# Patient Record
Sex: Male | Born: 1935 | Race: White | Hispanic: No | Marital: Single | Smoking: Former smoker
Health system: Southern US, Community
[De-identification: ages and names within clinical notes are randomized; demographics above are authoritative.]

## PROBLEM LIST (undated history)

## (undated) DIAGNOSIS — I714 Abdominal aortic aneurysm, without rupture, unspecified: Secondary | ICD-10-CM

## (undated) DIAGNOSIS — F419 Anxiety disorder, unspecified: Secondary | ICD-10-CM

## (undated) DIAGNOSIS — H269 Unspecified cataract: Secondary | ICD-10-CM

## (undated) DIAGNOSIS — I10 Essential (primary) hypertension: Secondary | ICD-10-CM

## (undated) HISTORY — DX: Unspecified cataract: H26.9

## (undated) HISTORY — PX: ABDOMINAL AORTIC ANEURYSM REPAIR: SUR1152

## (undated) HISTORY — DX: Essential (primary) hypertension: I10

## (undated) HISTORY — DX: Abdominal aortic aneurysm, without rupture, unspecified: I71.40

## (undated) HISTORY — DX: Abdominal aortic aneurysm, without rupture: I71.4

## (undated) HISTORY — DX: Anxiety disorder, unspecified: F41.9

---

## 2001-11-30 ENCOUNTER — Encounter: Payer: Self-pay | Admitting: Family Medicine

## 2001-11-30 ENCOUNTER — Encounter: Admission: RE | Admit: 2001-11-30 | Discharge: 2001-11-30 | Payer: Self-pay | Admitting: Family Medicine

## 2004-04-07 ENCOUNTER — Ambulatory Visit: Payer: Self-pay | Admitting: Family Medicine

## 2004-04-09 ENCOUNTER — Encounter: Admission: RE | Admit: 2004-04-09 | Discharge: 2004-04-09 | Payer: Self-pay | Admitting: Family Medicine

## 2005-05-31 ENCOUNTER — Ambulatory Visit: Payer: Self-pay | Admitting: Family Medicine

## 2006-05-09 HISTORY — PX: EYE SURGERY: SHX253

## 2006-06-02 ENCOUNTER — Ambulatory Visit: Payer: Self-pay | Admitting: Family Medicine

## 2006-06-02 LAB — CONVERTED CEMR LAB
ALT: 15 units/L (ref 0–40)
AST: 21 units/L (ref 0–37)
Albumin: 3.9 g/dL (ref 3.5–5.2)
Alkaline Phosphatase: 71 units/L (ref 39–117)
BUN: 11 mg/dL (ref 6–23)
Basophils Absolute: 0 10*3/uL (ref 0.0–0.1)
Basophils Relative: 0.4 % (ref 0.0–1.0)
CO2: 35 meq/L — ABNORMAL HIGH (ref 19–32)
Calcium: 9.6 mg/dL (ref 8.4–10.5)
Chloride: 101 meq/L (ref 96–112)
Cholesterol: 190 mg/dL (ref 0–200)
Creatinine, Ser: 0.7 mg/dL (ref 0.4–1.5)
Eosinophils Absolute: 0.3 10*3/uL (ref 0.0–0.6)
Eosinophils Relative: 4 % (ref 0.0–5.0)
GFR calc Af Amer: 140 mL/min
GFR calc non Af Amer: 116 mL/min
Glucose, Bld: 93 mg/dL (ref 70–99)
HCT: 45.4 % (ref 39.0–52.0)
HDL: 36.3 mg/dL — ABNORMAL LOW (ref >39.0)
Hemoglobin: 15.9 g/dL (ref 13.0–17.0)
LDL Cholesterol: 129 mg/dL — ABNORMAL HIGH (ref 0–99)
Lymphocytes Relative: 22.8 % (ref 12.0–46.0)
MCHC: 35.1 g/dL (ref 30.0–36.0)
MCV: 93.8 fL (ref 78.0–100.0)
Monocytes Absolute: 0.5 10*3/uL (ref 0.2–0.7)
Monocytes Relative: 7.4 % (ref 3.0–11.0)
Neutro Abs: 4.1 10*3/uL (ref 1.4–7.7)
Neutrophils Relative %: 65.4 % (ref 43.0–77.0)
PSA: 0.32 ng/mL (ref 0.10–4.00)
Platelets: 145 10*3/uL — ABNORMAL LOW (ref 150–400)
Potassium: 3.8 meq/L (ref 3.5–5.1)
RBC: 4.84 M/uL (ref 4.22–5.81)
RDW: 12.3 % (ref 11.5–14.6)
Sodium: 141 meq/L (ref 135–145)
TSH: 1.66 microintl units/mL (ref 0.35–5.50)
Total Bilirubin: 1.2 mg/dL (ref 0.3–1.2)
Total CHOL/HDL Ratio: 5.2
Total Protein: 7.3 g/dL (ref 6.0–8.3)
Triglycerides: 123 mg/dL (ref 0–149)
VLDL: 25 mg/dL (ref 0–40)
WBC: 6.3 10*3/uL (ref 4.5–10.5)

## 2006-06-15 ENCOUNTER — Ambulatory Visit: Payer: Self-pay | Admitting: Family Medicine

## 2007-05-16 ENCOUNTER — Telehealth: Payer: Self-pay | Admitting: Family Medicine

## 2007-06-20 ENCOUNTER — Ambulatory Visit: Payer: Self-pay | Admitting: Family Medicine

## 2007-06-20 DIAGNOSIS — E039 Hypothyroidism, unspecified: Secondary | ICD-10-CM | POA: Insufficient documentation

## 2007-06-20 DIAGNOSIS — G4700 Insomnia, unspecified: Secondary | ICD-10-CM | POA: Insufficient documentation

## 2007-06-20 DIAGNOSIS — E785 Hyperlipidemia, unspecified: Secondary | ICD-10-CM

## 2007-06-20 DIAGNOSIS — R634 Abnormal weight loss: Secondary | ICD-10-CM

## 2007-06-20 DIAGNOSIS — I10 Essential (primary) hypertension: Secondary | ICD-10-CM | POA: Insufficient documentation

## 2007-06-20 DIAGNOSIS — D649 Anemia, unspecified: Secondary | ICD-10-CM

## 2007-06-20 DIAGNOSIS — T50995A Adverse effect of other drugs, medicaments and biological substances, initial encounter: Secondary | ICD-10-CM | POA: Insufficient documentation

## 2007-06-20 LAB — CONVERTED CEMR LAB
Bilirubin Urine: NEGATIVE
Blood in Urine, dipstick: NEGATIVE
Glucose, Urine, Semiquant: NEGATIVE
Ketones, urine, test strip: NEGATIVE
Nitrite: NEGATIVE
Protein, U semiquant: NEGATIVE
Specific Gravity, Urine: 1.015
Urobilinogen, UA: 0.2
WBC Urine, dipstick: NEGATIVE
pH: 7.5

## 2007-06-20 LAB — HM COLONOSCOPY

## 2007-07-27 LAB — CONVERTED CEMR LAB
ALT: 16 units/L (ref 0–53)
AST: 24 units/L (ref 0–37)
Albumin: 4.2 g/dL (ref 3.5–5.2)
Alkaline Phosphatase: 58 units/L (ref 39–117)
BUN: 11 mg/dL (ref 6–23)
Basophils Absolute: 0 10*3/uL (ref 0.0–0.1)
Basophils Relative: 0 % (ref 0.0–1.0)
Bilirubin, Direct: 0.3 mg/dL (ref 0.0–0.3)
CO2: 31 meq/L (ref 19–32)
Calcium: 9.3 mg/dL (ref 8.4–10.5)
Chloride: 99 meq/L (ref 96–112)
Cholesterol: 185 mg/dL (ref 0–200)
Creatinine, Ser: 0.8 mg/dL (ref 0.4–1.5)
Eosinophils Absolute: 0.2 10*3/uL (ref 0.0–0.6)
Eosinophils Relative: 2.4 % (ref 0.0–5.0)
GFR calc Af Amer: 120 mL/min
GFR calc non Af Amer: 99 mL/min
Glucose, Bld: 106 mg/dL — ABNORMAL HIGH (ref 70–99)
HCT: 43.5 % (ref 39.0–52.0)
HDL: 33.8 mg/dL — ABNORMAL LOW (ref >39.0)
Hemoglobin: 15 g/dL (ref 13.0–17.0)
LDL Cholesterol: 132 mg/dL — ABNORMAL HIGH (ref 0–99)
Lymphocytes Relative: 15.9 % (ref 12.0–46.0)
MCHC: 34.4 g/dL (ref 30.0–36.0)
MCV: 93.8 fL (ref 78.0–100.0)
Monocytes Absolute: 0.4 10*3/uL (ref 0.2–0.7)
Monocytes Relative: 6.4 % (ref 3.0–11.0)
Neutro Abs: 4.7 10*3/uL (ref 1.4–7.7)
Neutrophils Relative %: 75.3 % (ref 43.0–77.0)
PSA: 0.29 ng/mL (ref 0.10–4.00)
Platelets: 124 10*3/uL — ABNORMAL LOW (ref 150–400)
Potassium: 3.7 meq/L (ref 3.5–5.1)
RBC: 4.64 M/uL (ref 4.22–5.81)
RDW: 12.4 % (ref 11.5–14.6)
Sodium: 138 meq/L (ref 135–145)
TSH: 0.98 microintl units/mL (ref 0.35–5.50)
Total Bilirubin: 1.4 mg/dL — ABNORMAL HIGH (ref 0.3–1.2)
Total CHOL/HDL Ratio: 5.5
Total Protein: 7 g/dL (ref 6.0–8.3)
Triglycerides: 97 mg/dL (ref 0–149)
VLDL: 19 mg/dL (ref 0–40)
WBC: 6.3 10*3/uL (ref 4.5–10.5)

## 2007-08-14 ENCOUNTER — Telehealth: Payer: Self-pay | Admitting: Family Medicine

## 2008-08-22 ENCOUNTER — Ambulatory Visit: Payer: Self-pay | Admitting: Family Medicine

## 2008-08-22 LAB — CONVERTED CEMR LAB
Bilirubin Urine: NEGATIVE
Blood in Urine, dipstick: NEGATIVE
Glucose, Urine, Semiquant: NEGATIVE
Ketones, urine, test strip: NEGATIVE
Nitrite: NEGATIVE
Specific Gravity, Urine: 1.015
Urobilinogen, UA: 0.2
WBC Urine, dipstick: NEGATIVE
pH: 7.5

## 2008-08-26 ENCOUNTER — Ambulatory Visit: Payer: Self-pay | Admitting: Family Medicine

## 2008-08-26 LAB — CONVERTED CEMR LAB
ALT: 18 units/L (ref 0–53)
AST: 27 units/L (ref 0–37)
Albumin: 4.1 g/dL (ref 3.5–5.2)
Alkaline Phosphatase: 72 units/L (ref 39–117)
BUN: 12 mg/dL (ref 6–23)
Basophils Absolute: 0 10*3/uL (ref 0.0–0.1)
Basophils Relative: 0.1 % (ref 0.0–3.0)
Bilirubin, Direct: 0.2 mg/dL (ref 0.0–0.3)
CO2: 33 meq/L — ABNORMAL HIGH (ref 19–32)
Calcium: 9.5 mg/dL (ref 8.4–10.5)
Chloride: 100 meq/L (ref 96–112)
Cholesterol: 179 mg/dL (ref 0–200)
Creatinine, Ser: 0.8 mg/dL (ref 0.4–1.5)
Eosinophils Absolute: 0.1 10*3/uL (ref 0.0–0.7)
Eosinophils Relative: 2.5 % (ref 0.0–5.0)
GFR calc non Af Amer: 98.76 mL/min (ref >60)
Glucose, Bld: 83 mg/dL (ref 70–99)
HCT: 43 % (ref 39.0–52.0)
HDL: 39.4 mg/dL (ref >39.00)
Hemoglobin: 14.7 g/dL (ref 13.0–17.0)
LDL Cholesterol: 127 mg/dL — ABNORMAL HIGH (ref 0–99)
Lymphocytes Relative: 19.7 % (ref 12.0–46.0)
Lymphs Abs: 1 10*3/uL (ref 0.7–4.0)
MCHC: 34.1 g/dL (ref 30.0–36.0)
MCV: 95.5 fL (ref 78.0–100.0)
Monocytes Absolute: 0.3 10*3/uL (ref 0.1–1.0)
Monocytes Relative: 6.1 % (ref 3.0–12.0)
Neutro Abs: 3.9 10*3/uL (ref 1.4–7.7)
Neutrophils Relative %: 71.6 % (ref 43.0–77.0)
PSA: 0.35 ng/mL (ref 0.10–4.00)
Platelets: 128 10*3/uL — ABNORMAL LOW (ref 150.0–400.0)
Potassium: 3.7 meq/L (ref 3.5–5.1)
RBC: 4.5 M/uL (ref 4.22–5.81)
RDW: 12.3 % (ref 11.5–14.6)
Sodium: 140 meq/L (ref 135–145)
TSH: 1.03 microintl units/mL (ref 0.35–5.50)
Total Bilirubin: 1.3 mg/dL — ABNORMAL HIGH (ref 0.3–1.2)
Total CHOL/HDL Ratio: 5
Total Protein: 7.3 g/dL (ref 6.0–8.3)
Triglycerides: 65 mg/dL (ref 0.0–149.0)
VLDL: 13 mg/dL (ref 0.0–40.0)
WBC: 5.3 10*3/uL (ref 4.5–10.5)

## 2008-12-18 ENCOUNTER — Telehealth: Payer: Self-pay | Admitting: Family Medicine

## 2009-02-18 ENCOUNTER — Telehealth: Payer: Self-pay | Admitting: Family Medicine

## 2009-09-22 ENCOUNTER — Ambulatory Visit: Payer: Self-pay | Admitting: Family Medicine

## 2009-11-05 ENCOUNTER — Ambulatory Visit: Payer: Self-pay | Admitting: Family Medicine

## 2009-11-05 DIAGNOSIS — F411 Generalized anxiety disorder: Secondary | ICD-10-CM | POA: Insufficient documentation

## 2009-11-05 DIAGNOSIS — E559 Vitamin D deficiency, unspecified: Secondary | ICD-10-CM | POA: Insufficient documentation

## 2009-11-05 DIAGNOSIS — R350 Frequency of micturition: Secondary | ICD-10-CM | POA: Insufficient documentation

## 2009-11-05 LAB — CONVERTED CEMR LAB
Bilirubin Urine: NEGATIVE
Blood in Urine, dipstick: NEGATIVE
Glucose, Urine, Semiquant: NEGATIVE
Ketones, urine, test strip: NEGATIVE
Nitrite: NEGATIVE
Specific Gravity, Urine: 1.015
Urobilinogen, UA: 0.2
WBC Urine, dipstick: NEGATIVE
pH: 8.5

## 2009-11-15 LAB — CONVERTED CEMR LAB: Vit D, 25-Hydroxy: 13 ng/mL — ABNORMAL LOW (ref 30–89)

## 2009-11-19 LAB — CONVERTED CEMR LAB
ALT: 15 units/L (ref 0–53)
AST: 24 units/L (ref 0–37)
Albumin: 4.4 g/dL (ref 3.5–5.2)
Alkaline Phosphatase: 65 units/L (ref 39–117)
BUN: 10 mg/dL (ref 6–23)
Basophils Absolute: 0 10*3/uL (ref 0.0–0.1)
Basophils Relative: 0.5 % (ref 0.0–3.0)
Bilirubin, Direct: 0.2 mg/dL (ref 0.0–0.3)
CO2: 36 meq/L — ABNORMAL HIGH (ref 19–32)
Calcium: 9.4 mg/dL (ref 8.4–10.5)
Chloride: 95 meq/L — ABNORMAL LOW (ref 96–112)
Cholesterol: 231 mg/dL — ABNORMAL HIGH (ref 0–200)
Creatinine, Ser: 0.7 mg/dL (ref 0.4–1.5)
Direct LDL: 156.5 mg/dL
Eosinophils Absolute: 0.2 10*3/uL (ref 0.0–0.7)
Eosinophils Relative: 3.1 % (ref 0.0–5.0)
GFR calc non Af Amer: 122.93 mL/min (ref >60)
Glucose, Bld: 80 mg/dL (ref 70–99)
HCT: 46.6 % (ref 39.0–52.0)
HDL: 40.5 mg/dL (ref >39.00)
Hemoglobin: 16 g/dL (ref 13.0–17.0)
Lymphocytes Relative: 19.6 % (ref 12.0–46.0)
Lymphs Abs: 1.3 10*3/uL (ref 0.7–4.0)
MCHC: 34.3 g/dL (ref 30.0–36.0)
MCV: 96 fL (ref 78.0–100.0)
Monocytes Absolute: 0.5 10*3/uL (ref 0.1–1.0)
Monocytes Relative: 8 % (ref 3.0–12.0)
Neutro Abs: 4.7 10*3/uL (ref 1.4–7.7)
Neutrophils Relative %: 68.8 % (ref 43.0–77.0)
PSA: 0.51 ng/mL (ref 0.10–4.00)
Platelets: 136 10*3/uL — ABNORMAL LOW (ref 150.0–400.0)
Potassium: 3.9 meq/L (ref 3.5–5.1)
RBC: 4.85 M/uL (ref 4.22–5.81)
RDW: 13.5 % (ref 11.5–14.6)
Sodium: 138 meq/L (ref 135–145)
TSH: 0.95 microintl units/mL (ref 0.35–5.50)
Total Bilirubin: 0.9 mg/dL (ref 0.3–1.2)
Total CHOL/HDL Ratio: 6
Total Protein: 7.5 g/dL (ref 6.0–8.3)
Triglycerides: 145 mg/dL (ref 0.0–149.0)
VLDL: 29 mg/dL (ref 0.0–40.0)
WBC: 6.9 10*3/uL (ref 4.5–10.5)

## 2010-06-08 NOTE — Assessment & Plan Note (Signed)
Summary: CPX (PT WILL COME IN FASTING) // RS   Vital Signs:  Patient profile:   77 year old male Height:      71 inches Weight:      152 pounds BMI:     21.28 O2 Sat:      92 % Temp:     97.8 degrees F Pulse rate:   56 / minute Pulse rhythm:   regular BP sitting:   110 / 64  (left arm)  Vitals Entered By: Pura Spice, RN (November 05, 2009 9:51 AM)  Contraindications/Deferment of Procedures/Staging:    Test/Procedure: Pneumovax vaccine    Reason for deferment: patient declined     Test/Procedure: Colonoscopy    Reason for deferment: patient declined  CC: go over problems fasting for labs  refill  Pt declined EKG   Is Patient Diabetic? 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 Also wants to discuss possibility of moving to [facility] His only complaint is that of some pain in his right knee but notrequiring treatment or treatment. He relates he injured this in the war Blood pressure is benign with good control Date taken the diazepam helps his anxiety which have been present for years there is has been never hyperactive symptoms of visual in the past has stopped losing weight  Allergies: 1)  ! Sulfa  Past History:  Past Medical History: Last updated: 06/20/2007 Hypertension  Past Surgical History: Last updated: 06/20/2007 Denies surgical history  Risk Factors: Smoking Status: quit (06/20/2007)  Review of Systems      See HPI  The patient denies anorexia, fever, weight loss, weight gain, vision loss, decreased hearing, hoarseness, chest pain, syncope, dyspnea on exertion, peripheral edema, prolonged cough, headaches, hemoptysis, abdominal pain, melena, hematochezia, severe indigestion/heartburn, hematuria, incontinence, genital sores, muscle weakness, suspicious skin lesions, transient blindness, difficulty  walking, depression, unusual weight change, abnormal bleeding, enlarged lymph nodes, angioedema, breast masses, and testicular masses.    Physical Exam  General:  Well-developed,well-nourished,in no acute distress; alert,appropriate and cooperative throughout examinationunderweight appearing.   Head:  Normocephalic and atraumatic without obvious abnormalities. No apparent alopecia or balding. Eyes:  No corneal or conjunctival inflammation noted. EOMI. Perrla. Funduscopic exam benign, without hemorrhages, exudates or papilledema. Vision grossly normal. Ears:  External ear exam shows no significant lesions or deformities.  Otoscopic examination reveals clear canals, tympanic membranes are intact bilaterally without bulging, retraction, inflammation or discharge. Hearing is grossly normal bilaterally. Nose:  External nasal examination shows no deformity or inflammation. Nasal mucosa are pink and moist without lesions or exudates. Mouth:  Oral mucosa and oropharynx without lesions or exudates.  Teeth in good repair. Neck:  No deformities, masses, or tenderness noted. Chest Wall:  No deformities, masses, tenderness or gynecomastia noted. Breasts:  No masses or gynecomastia noted Lungs:  Normal respiratory effort, chest expands symmetrically. Lungs are clear to auscultation, no crackles or wheezes. Heart:  Normal rate and regular rhythm. S1 and S2 normal without gallop, murmur, click, rub or other extra sounds. Abdomen:  Bowel sounds positive,abdomen soft and non-tender without masses, organomegaly or hernias noted. Rectal:  No external abnormalities noted. Normal sphincter tone. No rectal masses or tenderness. Genitalia:  Testes bilaterally descended without nodularity, tenderness or masses. No scrotal masses or lesions. No penis lesions or urethral discharge. Prostate:  Prostate gland firm and smooth,  no enlargement, nodularity, tenderness, mass, asymmetry or induration. Msk:  No deformity or  scoliosis noted of thoracic or lumbar spine.  unable to elicit any tenderness of the right knee Pulses:  R and L carotid,radial,femoral,dorsalis pedis and posterior tibial pulses are full and equal bilaterally Extremities:  No clubbing, cyanosis, edema, or deformity noted with normal full range of motion of all joints.   Neurologic:  No cranial nerve deficits noted. Station and gait are normal. Plantar reflexes are down-going bilaterally. DTRs are symmetrical throughout. Sensory, motor and coordinative functions appear intact. Skin:  Intact without suspicious lesions or rashes Cervical Nodes:  No lymphadenopathy noted Axillary Nodes:  No palpable lymphadenopathy Inguinal Nodes:  No significant adenopathy Psych:  Cognition and judgment appear intact. Alert and cooperative with normal attention span and concentration. No apparent delusions, illusions, hallucinations   Impression & Recommendations:  Problem # 1:  ANXIETY, CHRONIC (ICD-300.00) Assessment Unchanged  His updated medication list for this problem includes:    Diazepam 10 Mg Tabs (Diazepam) .Marland Kitchen... 1qd  Problem # 2:  FREQUENCY, URINARY (ICD-788.41) Assessment: New  Orders: UA Dipstick w/o Micro (automated)  (81003)negative urinalysis prostate negative  Problem # 3:  WEIGHT LOSS (ICD-783.21) Assessment: Improved  Problem # 4:  HYPERLIPIDEMIA (ICD-272.4) Assessment: Unchanged  Orders: TLB-Lipid Panel (80061-LIPID) TLB-Hepatic/Liver Function Pnl (80076-HEPATIC) will not take statins as prescribed  Complete Medication List: 1)  Hydrochlorothiazide 50 Mg Tabs (Hydrochlorothiazide) .... Once daily 2)  Propranolol Hcl Cr 80 Mg Cp24 (Propranolol hcl) .... Once daily 3)  Adult Aspirin Ec Low Strength 81 Mg Tbec (Aspirin) .... Q1 qd 4)  Diazepam 10 Mg Tabs (Diazepam) .Marland Kitchen.. 1qd 5)  Vitamin D (ergocalciferol) 50000 Unit Caps (Ergocalciferol) .Marland KitchenMarland KitchenMarland Kitchen 1 weekly for 12 weeks  Other Orders: Venipuncture (57846) T-Vitamin D (25-Hydroxy)  (96295-28413) TLB-BMP (Basic Metabolic Panel-BMET) (80048-METABOL) TLB-CBC Platelet - w/Differential (85025-CBCD) TLB-TSH (Thyroid Stimulating Hormone) (84443-TSH) TLB-PSA (Prostate Specific Antigen) (84153-PSA) Prescription Created Electronically [id number])  Patient Instructions: 1)  Physically doing well I am glad you have stopped losing weight 2)  Continue your regular medications 3)  Unable to explain the urinary frequency he is to persist and does not resolve please call or come back Prescriptions: VITAMIN D (ERGOCALCIFEROL) 50000 UNIT CAPS (ERGOCALCIFEROL) 1 weekly for 12 weeks  #12 x 1   Entered and Authorized by:   [name]   Signed by:   [name] on 11/15/2009   Method used:   Electronically to        [facility] [phone]* (retail)       [address]       [place], [place]  [address]       Ph: [phone]       Fax: [phone]   RxID:   [id number] PROPRANOLOL HCL CR 80 MG CP24 (PROPRANOLOL HCL) once daily  #90 x 3   Entered and Authorized by:   [name]   Signed by:   [name] on 11/05/2009   Method used:   Electronically to        [facility] [phone]* (retail)       [address]       [place], [place]  [address]       Ph: [phone]       Fax: [phone]   RxID:   [id number] HYDROCHLOROTHIAZIDE  50 MG TABS (HYDROCHLOROTHIAZIDE) once daily  #90 x 3   Entered and Authorized by:   [name]   Signed by:   [name] on 11/05/2009   Method used:   Electronically to        [facility] [phone]* (retail)       [address]       [place], [place]  [address]       Ph: [phone]       Fax: [phone]   RxID:   [id number]    Immunization History:  Tetanus/Td Immunization History:    Tetanus/Td:  historical (05/10/2007) per pt received at urgent care    Laboratory Results   Urine Tests  Date/Time Recieved: November 05, 2009 1:34 PM  Date/Time Reported: November 05, 2009 1:33 PM   Routine Urinalysis   Color: yellow Appearance: Clear Glucose: negative   (Normal Range: Negative) Bilirubin: negative   (Normal Range: Negative) Ketone: negative   (Normal Range: Negative) Spec. Gravity: 1.015   (Normal Range: 1.003-1.035) Blood: negative   (Normal Range: Negative) pH: 8.5   (Normal Range: 5.0-8.0) Protein: 1+   (Normal Range: Negative) Urobilinogen: 0.2   (Normal Range: 0-1) Nitrite: negative   (Normal Range: Negative) Leukocyte Esterace: negative   (Normal Range: Negative)    Comments: Wynona Canes, CMA  November 05, 2009 1:34 PM

## 2010-06-08 NOTE — Assessment & Plan Note (Signed)
Summary: follow up on meds/cjr   Vital Signs:  Patient profile:   77 year old male Weight:      154 pounds BMI:     21.56 O2 Sat:      92 % on Room air Temp:     97.4 degrees F oral Pulse rate:   60 / minute Pulse rhythm:   regular BP sitting:   128 / 80  (left arm) Cuff size:   regular  Vitals Entered By: [name] LPN (Sep 22, 2009 2:59 PM)  O2 Flow:  Room air CC: follow-up visit, med refills   History of Present Illness: Is a 64-year-old white male widower is in to discuss his medications and his problems. Requesting refill blood pressure 140/90 initially and then was 128/80 He relates he's been doing very well except study stressed with his son. Discussed how much he loves his little all B. reverse saphenous vein having happened he has had no new complaints and with no his blood pressure doing well has no headache and no longer has any palpitations nor episodes of tach cardia that he had prior to taking propranolol He has definitely decided not to move to [facility], feels sad but knowing this the best choice    Allergies: 1)  ! Sulfa  Past History:  Past Medical History: Last updated: 06/20/2007 Hypertension  Past Surgical History: Last updated: 06/20/2007 Denies surgical history  Risk Factors: Smoking Status: quit (06/20/2007)  Review of Systems  The patient denies anorexia, fever, weight loss, weight gain, vision loss, decreased hearing, hoarseness, chest pain, syncope, dyspnea on exertion, peripheral edema, prolonged cough, headaches, hemoptysis, abdominal pain, melena, hematochezia, severe indigestion/heartburn, hematuria, incontinence, genital sores, muscle weakness, suspicious skin lesions, transient blindness, difficulty walking, depression, unusual weight change, abnormal bleeding, enlarged lymph nodes, angioedema, breast masses, and testicular masses.    Physical Exam  General:  Well-developed,well-nourished,in no acute distress; alert,appropriate  and cooperative throughout examinationunderweight appearing.   Mouth:  teeth much better condition and better condition than on the last visit Lungs:  Normal respiratory effort, chest expands symmetrically. Lungs are clear to auscultation, no crackles or wheezes. Heart:  Normal rate and regular rhythm. S1 and S2 normal without gallop, murmur, click, rub or other extra sounds. Abdomen:  Bowel sounds positive,abdomen soft and non-tender without masses, organomegaly or hernias noted. Extremities:  No clubbing, cyanosis, edema, or deformity noted with normal full range of motion of all joints.     Impression & Recommendations:  Problem # 1:  HYPERTENSION (ICD-401.9) Assessment Improved  His updated medication list for this problem includes:    Hydrochlorothiazide 50 Mg Tabs (Hydrochlorothiazide) ..... Once daily    Propranolol Hcl Cr 80 Mg Cp24 (Propranolol hcl) ..... Once daily  Orders: Prescription Created Electronically [phone])  Problem # 2:  INSOMNIA (ICD-780.52) Assessment: Improved  Problem # 3:  WEIGHT LOSS (ICD-783.21) Assessment: Improved  Complete Medication List: 1)  Hydrochlorothiazide 50 Mg Tabs (Hydrochlorothiazide) .... Once daily 2)  Propranolol Hcl Cr 80 Mg Cp24 (Propranolol hcl) .... Once daily 3)  Adult Aspirin Ec Low Strength 81 Mg Tbec (Aspirin) .... Q1 qd 4)  Diazepam 10 Mg Tabs (Diazepam) .Marland Kitchen.. 1qd  Patient Instructions: 1)  failure doing her well and continue medications as prescribed. I have refilled your necessary medications 2)  Requested to return in 3-4 months for a physical physical examination as well as laboratory studies Prescriptions: DIAZEPAM 10 MG  TABS (DIAZEPAM) 1qd  #90 x 1   Entered by:  [name] LPN   Authorized by:   [name]   Signed by:   [name] LPN on 16/02/9603   Method used:   Telephoned to ...       [facility] [phone]* (retail)       [address]       [place], [place]  [address]       Ph: [phone]       Fax: [phone]   RxID:   [id number] DIAZEPAM 10 MG  TABS (DIAZEPAM) 1qd  #90 x 1   Entered and Authorized by:   [name]   Signed by:   [name] on 09/22/2009   Method used:   Print then Give to Patient   RxID:   [id number] HYDROCHLOROTHIAZIDE 50 MG TABS (HYDROCHLOROTHIAZIDE) once daily  #90 x 3   Entered and Authorized by:   [name]   Signed by:   [name] on 09/22/2009   Method used:   Electronically to        [facility] [phone]* (retail)       [address]       [place], [place]  [address]       Ph: [phone]       Fax: [phone]   RxID:   [id number] PROPRANOLOL HCL CR 80 MG CP24 (PROPRANOLOL HCL) once daily  #90 x 11   Entered and Authorized by:   [name]   Signed by:   [name] on 09/22/2009   Method used:   Electronically to        [facility] [phone]* (retail)       [address]       [place], [place]  [address]       Ph: [phone]       Fax: [phone]   RxID:   [id number] DIAZEPAM 10 MG  TABS (DIAZEPAM) 1 three times a day as needed stress  #90 x 5   Entered and Authorized by:   [name]   Signed by:   [name] on 09/22/2009   Method used:   Print then Give to Patient   RxID:   [id number]

## 2010-11-01 ENCOUNTER — Other Ambulatory Visit: Payer: Self-pay | Admitting: Family Medicine

## 2010-11-02 NOTE — Telephone Encounter (Signed)
Checking status of HCTZ refill from [facility]. Pt is almost out.

## 2010-11-09 ENCOUNTER — Ambulatory Visit (INDEPENDENT_AMBULATORY_CARE_PROVIDER_SITE_OTHER): Payer: Medicare PPO | Admitting: Family Medicine

## 2010-11-09 ENCOUNTER — Encounter: Payer: Self-pay | Admitting: Family Medicine

## 2010-11-09 VITALS — BP 110/72 | HR 59 | Temp 97.7°F | Ht 72.0 in | Wt 152.0 lb

## 2010-11-09 DIAGNOSIS — E785 Hyperlipidemia, unspecified: Secondary | ICD-10-CM

## 2010-11-09 DIAGNOSIS — Z136 Encounter for screening for cardiovascular disorders: Secondary | ICD-10-CM

## 2010-11-09 DIAGNOSIS — I10 Essential (primary) hypertension: Secondary | ICD-10-CM

## 2010-11-09 DIAGNOSIS — R351 Nocturia: Secondary | ICD-10-CM

## 2010-11-09 DIAGNOSIS — E039 Hypothyroidism, unspecified: Secondary | ICD-10-CM

## 2010-11-09 DIAGNOSIS — N401 Enlarged prostate with lower urinary tract symptoms: Secondary | ICD-10-CM

## 2010-11-09 DIAGNOSIS — E559 Vitamin D deficiency, unspecified: Secondary | ICD-10-CM

## 2010-11-09 DIAGNOSIS — N138 Other obstructive and reflux uropathy: Secondary | ICD-10-CM

## 2010-11-09 DIAGNOSIS — N139 Obstructive and reflux uropathy, unspecified: Secondary | ICD-10-CM

## 2010-11-09 DIAGNOSIS — D649 Anemia, unspecified: Secondary | ICD-10-CM

## 2010-11-09 LAB — LIPID PANEL
HDL: 41.7 mg/dL (ref >39.00)
Total CHOL/HDL Ratio: 5

## 2010-11-09 LAB — BASIC METABOLIC PANEL
BUN: 15 mg/dL (ref 6–23)
Calcium: 9.7 mg/dL (ref 8.4–10.5)
Chloride: 96 mEq/L (ref 96–112)
Creatinine, Ser: 0.8 mg/dL (ref 0.4–1.5)
GFR: 98.21 mL/min (ref >60.00)

## 2010-11-09 LAB — POCT URINALYSIS DIPSTICK
Bilirubin, UA: NEGATIVE
Blood, UA: NEGATIVE
Ketones, UA: NEGATIVE
Protein, UA: NEGATIVE
Spec Grav, UA: 1.015
pH, UA: 8

## 2010-11-09 LAB — CBC WITH DIFFERENTIAL/PLATELET
Basophils Relative: 0.6 % (ref 0.0–3.0)
Eosinophils Relative: 4.7 % (ref 0.0–5.0)
HCT: 45.6 % (ref 39.0–52.0)
Hemoglobin: 15.8 g/dL (ref 13.0–17.0)
Lymphs Abs: 1.5 10*3/uL (ref 0.7–4.0)
MCV: 95.4 fl (ref 78.0–100.0)
Monocytes Absolute: 0.7 10*3/uL (ref 0.1–1.0)
Monocytes Relative: 8.9 % (ref 3.0–12.0)
Neutro Abs: 5.4 10*3/uL (ref 1.4–7.7)
Platelets: 141 10*3/uL — ABNORMAL LOW (ref 150.0–400.0)
WBC: 8 10*3/uL (ref 4.5–10.5)

## 2010-11-09 LAB — HEPATIC FUNCTION PANEL
AST: 26 U/L (ref 0–37)
Total Bilirubin: 1 mg/dL (ref 0.3–1.2)

## 2010-11-09 LAB — TSH: TSH: 1.02 u[IU]/mL (ref 0.35–5.50)

## 2010-11-09 MED ORDER — PROPRANOLOL HCL ER 80 MG PO CP24: 80.0000 mg | ORAL_CAPSULE | Freq: Every day | ORAL | Status: DC

## 2010-11-09 MED ORDER — HYDROCHLOROTHIAZIDE 50 MG PO TABS: 50.0000 mg | ORAL_TABLET | Freq: Every day | ORAL | Status: DC

## 2010-11-09 MED ORDER — ASPIRIN 325 MG PO TABS: 325.0000 mg | ORAL_TABLET | Freq: Every day | ORAL | Status: AC

## 2010-11-09 MED ORDER — DICLOFENAC SODIUM 75 MG PO TBEC: 75.0000 mg | DELAYED_RELEASE_TABLET | Freq: Two times a day (BID) | ORAL | Status: AC

## 2010-11-10 LAB — VITAMIN D 25 HYDROXY (VIT D DEFICIENCY, FRACTURES): Vit D, 25-Hydroxy: 13 ng/mL — ABNORMAL LOW (ref 30–89)

## 2010-12-13 ENCOUNTER — Encounter: Payer: Self-pay | Admitting: Family Medicine

## 2010-12-13 NOTE — Patient Instructions (Addendum)
In general I feel you're doing very well and recommend you continue the same medication that you're taking and you can take diclofenac 75 mg twice daily for arthritis Continue to take her diazepam for anxiety or stress

## 2010-12-13 NOTE — Progress Notes (Signed)
  Subjective:    Patient ID: Donzetta Kohut, male    DOB: [birth year], 77 y.o.   MRN: [id number] This 77 year old white widower is in to discuss his medical problem, renew his medication get up to date immunizations . He relates she continues to have pain in his knees from his arthritis with her shirt up and well controlled 110/72 his last colonoscopic exam at 2009 normal, he also needs pneumococcal vaccine as well as Zostavax HPI    Review of Systems  Constitutional: Negative.   HENT: Negative.   Eyes: Negative.   Respiratory: Negative.   Cardiovascular: Negative.   Gastrointestinal: Negative.   Genitourinary: Negative.   Musculoskeletal: Negative.   Neurological: Negative.   Hematological: Negative.   Psychiatric/Behavioral: Positive for sleep disturbance and agitation. The patient is nervous/anxious.        Anxiety       Objective:   Physical Exam the patient is a well developed well-nourished white thin male in no distress HEENT negative carotid pulses are good thyroid is normal Lungs clear to palpation percussion and auscultation no rales no wheezing no dullness Heart examination no cardiomegaly heart sounds good without murmurs peripheral pulses good equal bilaterally elsewhere rhythm regular electrocardiogram normal Abdomen liver spleen kidneys are nonpalpable no masses no tenderness bowel sounds normal  Percussion aorta normal Genitalia normal Rectal exam reveals normal findings prostate is slightly larger than normal nodules no tenderness Examination of the extremities negative except for minimal tenderness on examination of both knees Neurological examination is negative Skin exam negative            Assessment & Plan:

## 2011-12-12 ENCOUNTER — Ambulatory Visit (INDEPENDENT_AMBULATORY_CARE_PROVIDER_SITE_OTHER): Payer: Medicare PPO | Admitting: Emergency Medicine

## 2011-12-12 VITALS — BP 130/80 | HR 60 | Temp 98.0°F | Resp 16 | Ht 70.0 in | Wt 148.2 lb

## 2011-12-12 DIAGNOSIS — R251 Tremor, unspecified: Secondary | ICD-10-CM

## 2011-12-12 DIAGNOSIS — R259 Unspecified abnormal involuntary movements: Secondary | ICD-10-CM

## 2011-12-12 DIAGNOSIS — R1900 Intra-abdominal and pelvic swelling, mass and lump, unspecified site: Secondary | ICD-10-CM

## 2011-12-12 DIAGNOSIS — I10 Essential (primary) hypertension: Secondary | ICD-10-CM

## 2011-12-12 DIAGNOSIS — M129 Arthropathy, unspecified: Secondary | ICD-10-CM

## 2011-12-12 DIAGNOSIS — M1990 Unspecified osteoarthritis, unspecified site: Secondary | ICD-10-CM

## 2011-12-12 DIAGNOSIS — Z0000 Encounter for general adult medical examination without abnormal findings: Secondary | ICD-10-CM

## 2011-12-12 LAB — COMPREHENSIVE METABOLIC PANEL
AST: 21 U/L (ref 0–37)
Albumin: 4.5 g/dL (ref 3.5–5.2)
BUN: 24 mg/dL — ABNORMAL HIGH (ref 6–23)
CO2: 34 mEq/L — ABNORMAL HIGH (ref 19–32)
Calcium: 10.5 mg/dL (ref 8.4–10.5)
Chloride: 99 mEq/L (ref 96–112)
Creat: 1 mg/dL (ref 0.50–1.35)
Glucose, Bld: 94 mg/dL (ref 70–99)
Potassium: 5 mEq/L (ref 3.5–5.3)

## 2011-12-12 LAB — POCT CBC
HCT, POC: 48.5 % (ref 43.5–53.7)
Hemoglobin: 14.9 g/dL (ref 14.1–18.1)
Lymph, poc: 1.3 (ref 0.6–3.4)
MCH, POC: 30.4 pg (ref 27–31.2)
MCHC: 30.7 g/dL — AB (ref 31.8–35.4)
MCV: 98.9 fL — AB (ref 80–97)
POC Granulocyte: 5.8 (ref 2–6.9)
POC LYMPH PERCENT: 16.9 %L (ref 10–50)
RDW, POC: 12.4 %
WBC: 7.5 10*3/uL (ref 4.6–10.2)

## 2011-12-12 MED ORDER — DICLOFENAC SODIUM 75 MG PO TBEC: 75.0000 mg | DELAYED_RELEASE_TABLET | Freq: Two times a day (BID) | ORAL | Status: DC

## 2011-12-12 MED ORDER — HYDROCHLOROTHIAZIDE 50 MG PO TABS: 50.0000 mg | ORAL_TABLET | Freq: Every day | ORAL | Status: DC

## 2011-12-12 MED ORDER — PROPRANOLOL HCL ER 80 MG PO CP24: 80.0000 mg | ORAL_CAPSULE | Freq: Every day | ORAL | Status: DC

## 2011-12-12 NOTE — Progress Notes (Deleted)
  Subjective:    Patient ID: Jason Cameron, male    DOB: 06-26-1925, 78 y.o.   MRN: [id number]  HPI  78 year old male presents who is looking to establish care History of arthritis, high blood pressure. Patient states he feels well. Denies chest pain. Denies any problems at this time. Former smoker, quit 30 years ago.  Review of Systems     Objective:   Physical Exam        Assessment & Plan:

## 2011-12-12 NOTE — Progress Notes (Signed)
  Subjective:    Patient ID: Jason Cameron, male    DOB: 09/10/1925, 78 y.o.   MRN: [id number]  HPI 77 year old male presents who is looking to establish care History of arthritis, high blood pressure. Patient states he feels well. Denies chest pain. Denies any problems at this time. Former smoker, quit 30 years ago.   Review of Systems     Objective:   Physical Exam  Constitutional: He appears well-developed and well-nourished.  Eyes: Pupils are equal, round, and reactive to light.  Cardiovascular: Normal rate and regular rhythm.   Pulmonary/Chest: Effort normal.  Abdominal: Soft.       Aorta approx 5 cm.No bruit          Assessment & Plan:

## 2011-12-15 ENCOUNTER — Ambulatory Visit
Admission: RE | Admit: 2011-12-15 | Discharge: 2011-12-15 | Disposition: A | Discharge status: Home / Self Care | Payer: Medicare PPO | Source: Ambulatory Visit | Attending: Emergency Medicine | Admitting: Emergency Medicine

## 2011-12-15 DIAGNOSIS — Z0000 Encounter for general adult medical examination without abnormal findings: Secondary | ICD-10-CM

## 2011-12-18 ENCOUNTER — Encounter: Payer: Self-pay | Admitting: Radiology

## 2011-12-19 ENCOUNTER — Other Ambulatory Visit: Payer: Self-pay | Admitting: Radiology

## 2011-12-19 DIAGNOSIS — I714 Abdominal aortic aneurysm, without rupture: Secondary | ICD-10-CM

## 2011-12-23 ENCOUNTER — Encounter: Payer: Self-pay | Admitting: Vascular Surgery

## 2011-12-26 ENCOUNTER — Encounter: Payer: Self-pay | Admitting: Vascular Surgery

## 2011-12-26 ENCOUNTER — Ambulatory Visit (INDEPENDENT_AMBULATORY_CARE_PROVIDER_SITE_OTHER): Payer: Medicare PPO | Admitting: Vascular Surgery

## 2011-12-26 VITALS — BP 136/77 | HR 57 | Resp 20 | Ht 70.0 in | Wt 145.0 lb

## 2011-12-26 DIAGNOSIS — Z01818 Encounter for other preprocedural examination: Secondary | ICD-10-CM

## 2011-12-26 DIAGNOSIS — I714 Abdominal aortic aneurysm, without rupture: Secondary | ICD-10-CM

## 2011-12-26 NOTE — Progress Notes (Signed)
Subjective:     Patient ID: Jason Cameron, male   DOB: 15-Oct-1925, 78 y.o.   MRN: [id number]  HPI this 79 year old male was recently found to have a abdominal aortic aneurysm measuring 4.8 x 5.1 cm in maximum diameter. This was on the ultrasound exam ordered by Dr. Lesle Chris. Patient denies any abdominal or back symptoms. He has a remote history of knowledge of a small aortic aneurysm many years ago which has not been followed up. He has no history of coronary artery disease or stroke.  Past Medical History  Diagnosis Date  . Hypertension   . Anxiety   . AAA (abdominal aortic aneurysm)     History  Substance Use Topics  . Smoking status: Former Smoker -- 20 years    Types: Cigarettes    Quit date: 12/25/1981  . Smokeless tobacco: Never Used  . Alcohol Use: No    Family History  Problem Relation Age of Onset  . COPD Mother     Allergies  Allergen Reactions  . Sulfonamide Derivatives     Current outpatient prescriptions:diclofenac (VOLTAREN) 75 MG EC tablet, Take 1 tablet (75 mg total) by mouth 2 (two) times daily., Disp: 60 tablet, Rfl: 4;  hydrochlorothiazide (HYDRODIURIL) 50 MG tablet, Take 1 tablet (50 mg total) by mouth daily., Disp: 90 tablet, Rfl: 3;  propranolol ER (INDERAL LA) 80 MG 24 hr capsule, Take 1 capsule (80 mg total) by mouth daily., Disp: 90 capsule, Rfl: 3  BP 136/77  Pulse 57  Resp 20  Ht 5\' 10"  (1.778 m)  Wt 145 lb (65.772 kg)  BMI 20.81 kg/m2  Body mass index is 20.81 kg/(m^2).        Review of Systems denies chest pain, dyspnea on exertion, PND, orthopnea, hemoptysis, lateralizing weakness, amaurosis fugax, diplopia, syncope, claudication. All systems negative and complete review of systems except for right inguinal hernia which has been present for many years    Objective:   Physical Exam blood pressure 136/77 heart rate 57 respirations 20 Gen.-alert and oriented x3 in no apparent distress HEENT normal for age Lungs no rhonchi or  wheezing Cardiovascular regular rhythm no murmurs carotid pulses 3+ palpable no bruits audible Abdomen soft nontender 5 cm pulsatile mass in the epigastrium-nontender  Musculoskeletal free of  major deformities Skin clear -no rashes Neurologic normal Lower extremities 3+ femoral and dorsalis pedis pulses palpable bilaterally with no edema  I reviewed the ultrasound report which was performed on 12/19/2011 which reveals a distal aortic aneurysm measuring 4.8 x 5.1 cm.     Assessment:     #1 infrarenal abdominal aortic aneurysm exceeding 5 cm in diameter #2 no evidence or history of coronary artery disease or cerebrovascular disease    Plan:     #1 we'll obtain CT angiogram of abdomen and pelvis to see if patient can of it for aortic stent grafting #2 Will order Cardiolite #3 return to see me in 3 weeks to discuss the above findings

## 2011-12-26 NOTE — Addendum Note (Signed)
Addended by: Sharee Pimple on: 12/26/2011 12:05 PM   Modules accepted: Orders

## 2012-01-02 ENCOUNTER — Ambulatory Visit (HOSPITAL_COMMUNITY): Payer: Medicare PPO | Attending: Vascular Surgery | Admitting: Radiology

## 2012-01-02 VITALS — BP 131/78 | HR 58 | Ht 70.0 in | Wt 145.0 lb

## 2012-01-02 DIAGNOSIS — R002 Palpitations: Secondary | ICD-10-CM | POA: Insufficient documentation

## 2012-01-02 DIAGNOSIS — Z8673 Personal history of transient ischemic attack (TIA), and cerebral infarction without residual deficits: Secondary | ICD-10-CM | POA: Insufficient documentation

## 2012-01-02 DIAGNOSIS — Z87891 Personal history of nicotine dependence: Secondary | ICD-10-CM | POA: Insufficient documentation

## 2012-01-02 DIAGNOSIS — Z01810 Encounter for preprocedural cardiovascular examination: Secondary | ICD-10-CM

## 2012-01-02 DIAGNOSIS — I714 Abdominal aortic aneurysm, without rupture: Secondary | ICD-10-CM

## 2012-01-02 DIAGNOSIS — Z01818 Encounter for other preprocedural examination: Secondary | ICD-10-CM

## 2012-01-02 DIAGNOSIS — I10 Essential (primary) hypertension: Secondary | ICD-10-CM | POA: Insufficient documentation

## 2012-01-02 DIAGNOSIS — I719 Aortic aneurysm of unspecified site, without rupture: Secondary | ICD-10-CM

## 2012-01-02 DIAGNOSIS — R5381 Other malaise: Secondary | ICD-10-CM | POA: Insufficient documentation

## 2012-01-02 DIAGNOSIS — R5383 Other fatigue: Secondary | ICD-10-CM | POA: Insufficient documentation

## 2012-01-02 MED ORDER — REGADENOSON 0.4 MG/5ML IV SOLN
0.4000 mg | Freq: Once | INTRAVENOUS | Status: AC
  Administered 2012-01-02: 0.4 mg via INTRAVENOUS

## 2012-01-02 MED ORDER — TECHNETIUM TC 99M TETROFOSMIN IV KIT
33.0000 | PACK | Freq: Once | INTRAVENOUS | Status: AC | PRN
  Administered 2012-01-02: 33 via INTRAVENOUS

## 2012-01-02 MED ORDER — TECHNETIUM TC 99M TETROFOSMIN IV KIT
10.6000 | PACK | Freq: Once | INTRAVENOUS | Status: AC | PRN
  Administered 2012-01-02: 11 via INTRAVENOUS

## 2012-01-02 NOTE — Progress Notes (Signed)
[facility] 3 NUCLEAR MED [address] [place] [address] [phone]  Cardiology Nuclear Med Study  Jason Cameron is a 78 y.o. male     MRN : [id number]     DOB: April 12, 1926  Procedure Date: 01/02/2012  Nuclear Med Background Indication for Stress Test:  Evaluation for Ischemia and Pending Clearance for AAA Repair (5.1cm) by Dr. Josephina Gip History:  No previously documented CAD. Cardiac Risk Factors: History of Smoking, Hypertension and TIA  Symptoms:  Fatigue and Palpitations   Nuclear Pre-Procedure Caffeine/Decaff Intake:  None NPO After: 7:00pm   Lungs:  Clear. O2 Sat: 98% on room air. IV 0.9% NS with Angio Cath:  20g  IV Site: R Wrist  IV Started by:  Cathlyn Parsons, RN  Chest Size (in):  38 Cup Size: n/a  Height: 5\' 10"  (1.778 m)  Weight:  145 lb (65.772 kg)  BMI:  Body mass index is 20.81 kg/(m^2). Tech Comments:  Propranolol held x 24 hours    Nuclear Med Study 1 or 2 day study: 1 day  Stress Test Type:  Lexiscan  Reading MD: Dietrich Pates, MD  Order Authorizing Provider:  Arnoldo Lenis  Resting Radionuclide: Technetium 80m Tetrofosmin  Resting Radionuclide Dose: 10.6 mCi   Stress Radionuclide:  Technetium 56m Tetrofosmin  Stress Radionuclide Dose: 33.0 mCi           Stress Protocol Rest HR: 58 Stress HR: 81  Rest BP: 131/78 Stress BP: 129/58  Exercise Time (min): n/a METS: n/a   Predicted Max HR: 137 bpm % Max HR: 59.12 bpm Rate Pressure Product: 21308   Dose of Adenosine (mg):  n/a Dose of Lexiscan: 0.4 mg  Dose of Atropine (mg): n/a Dose of Dobutamine: n/a mcg/kg/min (at max HR)  Stress Test Technologist: Smiley Houseman, CMA-N  Nuclear Technologist:  Domenic Polite, CNMT     Rest Procedure:  Myocardial perfusion imaging was performed at rest 45 minutes following the intravenous administration of Technetium 64m Tetrofosmin.  Rest ECG: sinus bradycardia  58 bpm.  Stress Procedure:  The patient received IV Lexiscan  0.4 mg over 15-seconds.  Technetium 5m Tetrofosmin injected at 30-seconds.  There were no significant changes with Lexiscan, occasional PVC's noted.  Quantitative spect images were obtained after a 45 minute delay.  Stress ECG: No significant change from baseline ECG  QPS Raw Data Images:  Images were motion corrected. Stress Images:  Normal homogeneous uptake in all areas of the myocardium. Rest Images:  Normal homogeneous uptake in all areas of the myocardium. Subtraction (SDS):  No evidence of ischemia. Transient Ischemic Dilatation (Normal <1.22):  0.93 Lung/Heart Ratio (Normal <0.45):  0.37  Quantitative Gated Spect Images QGS EDV:  90 ml QGS ESV:  28 ml  Impression Exercise Capacity:  Lexiscan with no exercise. BP Response:  Normal blood pressure response. Clinical Symptoms:  No significant symptoms noted. ECG Impression:  No significant ST segment change suggestive of ischemia. Comparison with Prior Nuclear Study: No images to compare.  Overall Impression:  Normal stress nuclear study.  LV Ejection Fraction: 68%.  LV Wall Motion:  NL LV Function; NL Wall Motion

## 2012-01-16 ENCOUNTER — Encounter: Payer: Self-pay | Admitting: Vascular Surgery

## 2012-01-17 ENCOUNTER — Ambulatory Visit (INDEPENDENT_AMBULATORY_CARE_PROVIDER_SITE_OTHER): Payer: Medicare PPO | Admitting: Vascular Surgery

## 2012-01-17 ENCOUNTER — Ambulatory Visit
Admission: RE | Admit: 2012-01-17 | Discharge: 2012-01-17 | Disposition: A | Discharge status: Home / Self Care | Payer: Medicare PPO | Source: Ambulatory Visit | Attending: Vascular Surgery | Admitting: Vascular Surgery

## 2012-01-17 ENCOUNTER — Encounter: Payer: Self-pay | Admitting: Vascular Surgery

## 2012-01-17 VITALS — BP 123/73 | HR 69 | Resp 20 | Ht 70.0 in | Wt 143.0 lb

## 2012-01-17 DIAGNOSIS — I714 Abdominal aortic aneurysm, without rupture, unspecified: Secondary | ICD-10-CM | POA: Insufficient documentation

## 2012-01-17 MED ORDER — IOHEXOL 350 MG/ML SOLN
100.0000 mL | Freq: Once | INTRAVENOUS | Status: AC | PRN
  Administered 2012-01-17: 100 mL via INTRAVENOUS

## 2012-01-17 NOTE — Progress Notes (Signed)
Subjective:     Patient ID: Jason Cameron, male   DOB: 09/08/1925, 78 y.o.   MRN: [id number]  HPI this 78-year-old male returns for further discussion regarding his infrarenal abdominal aortic aneurysm. He had a Cardiolite study performed which revealed a good ejection fraction no evidence of ischemia. He has had no abdominal or back symptoms.   Past Medical History  Diagnosis Date  . Hypertension   . Anxiety   . AAA (abdominal aortic aneurysm)     History  Substance Use Topics  . Smoking status: Former Smoker -- 20 years    Types: Cigarettes    Quit date: 12/25/1981  . Smokeless tobacco: Never Used  . Alcohol Use: No    Family History  Problem Relation Age of Onset  . COPD Mother     Allergies  Allergen Reactions  . Sulfonamide Derivatives     Current outpatient prescriptions:diclofenac (VOLTAREN) 75 MG EC tablet, Take 1 tablet (75 mg total) by mouth 2 (two) times daily., Disp: 60 tablet, Rfl: 4;  hydrochlorothiazide (HYDRODIURIL) 50 MG tablet, Take 1 tablet (50 mg total) by mouth daily., Disp: 90 tablet, Rfl: 3;  ibuprofen (ADVIL,MOTRIN) 100 MG tablet, Take 100 mg by mouth every 6 (six) hours as needed., Disp: , Rfl:  propranolol ER (INDERAL LA) 80 MG 24 hr capsule, Take 1 capsule (80 mg total) by mouth daily., Disp: 90 capsule, Rfl: 3 No current facility-administered medications for this visit. Facility-Administered Medications Ordered in Other Visits: iohexol (OMNIPAQUE) 350 MG/ML injection 100 mL, 100 mL, Intravenous, Once PRN, Medication Radiologist, MD, 100 mL at 01/17/12 0949  BP 123/73  Pulse 69  Resp 20  Ht 5' 10" (1.778 m)  Wt 143 lb (64.864 kg)  BMI 20.52 kg/m2  Body mass index is 20.52 kg/(m^2).           Review of Systems denies chest pain, dyspnea on exertion, PND, orthopnea. Does have occasional dizziness and headache complains of varicose veins. Other systems negative and complete review of systems    Objective:   Physical Exam blood  pressure 123/73 heart rate 69 respirations 20 Gen.-alert and oriented x3 in no apparent distress HEENT normal for age Lungs no rhonchi or wheezing Cardiovascular regular rhythm no murmurs carotid pulses 3+ palpable no bruits audible Abdomen soft nontender -5 cm pulsatile mass  Musculoskeletal free of  major deformities Skin clear -no rashes Neurologic normal Lower extremities 3+ femoral and dorsalis pedis pulses palpable bilaterally with no edema  Data ordered a CT angiogram which I reviewed the computer. He does have a 5 cm infrarenal aortic aneurysm. There is a sharp 90 angulation of the neck just below the renal arteries but when the neck straightens it is consistently smoothed for about 3 cm before the aneurysm begins. Common iliac arteries are unremarkable.     Assessment:     Infrarenal abdominal aortic aneurysm with tortuous angulated infrarenal neck-longneck below this-I think patient will be candidate for an endograft    Plan:      schedule insertion of a Gore excluder stent graft on Wednesday, September 18. Risks and benefits and potential for open repair discussed with patient and his son and they are agreeable and would like to proceed as soon as we can schedule.  

## 2012-01-18 ENCOUNTER — Encounter (HOSPITAL_COMMUNITY): Payer: Self-pay | Admitting: Pharmacy Technician

## 2012-01-18 ENCOUNTER — Other Ambulatory Visit: Payer: Self-pay

## 2012-01-20 NOTE — Pre-Procedure Instructions (Signed)
20 Jason Cameron  01/20/2012   Your procedure is scheduled on:  Wednesday September 18  Report to [facility] Short Stay Center at 6:30 AM.  Call this number if you have problems the morning of surgery: [phone]   Remember:   Do not eat or drink:After Midnight.    Take these medicines the morning of surgery with A SIP OF WATER: Propranolol (Inderal)   Do not wear jewelry, make-up or nail polish.  Do not wear lotions, powders, or perfumes. You may wear deodorant.  Do not shave 48 hours prior to surgery. Men may shave face and neck.  Do not bring valuables to the hospital.  Contacts, dentures or bridgework may not be worn into surgery.  Leave suitcase in the car. After surgery it may be brought to your room.  For patients admitted to the hospital, checkout time is 11:00 AM the day of discharge.   Patients discharged the day of surgery will not be allowed to drive home.  Name and phone number of your driver: NA  Special Instructions: CHG Shower Use Special Wash: 1/2 bottle night before surgery and 1/2 bottle morning of surgery.   Please read over the following fact sheets that you were given: Pain Booklet, Coughing and Deep Breathing, Blood Transfusion Information and Surgical Site Infection Prevention

## 2012-01-23 ENCOUNTER — Encounter (HOSPITAL_COMMUNITY): Payer: Self-pay

## 2012-01-23 ENCOUNTER — Ambulatory Visit (HOSPITAL_COMMUNITY)
Admission: RE | Admit: 2012-01-23 | Discharge: 2012-01-23 | Disposition: A | Discharge status: Home / Self Care | Payer: Medicare HMO | Source: Ambulatory Visit | Attending: Vascular Surgery | Admitting: Vascular Surgery

## 2012-01-23 ENCOUNTER — Encounter (HOSPITAL_COMMUNITY)
Admission: RE | Admit: 2012-01-23 | Discharge: 2012-01-23 | Disposition: A | Discharge status: Home / Self Care | Payer: Medicare HMO | Source: Ambulatory Visit | Attending: Vascular Surgery | Admitting: Vascular Surgery

## 2012-01-23 DIAGNOSIS — Z01810 Encounter for preprocedural cardiovascular examination: Secondary | ICD-10-CM | POA: Insufficient documentation

## 2012-01-23 DIAGNOSIS — Z01812 Encounter for preprocedural laboratory examination: Secondary | ICD-10-CM | POA: Insufficient documentation

## 2012-01-23 DIAGNOSIS — I714 Abdominal aortic aneurysm, without rupture, unspecified: Secondary | ICD-10-CM | POA: Insufficient documentation

## 2012-01-23 DIAGNOSIS — Z01818 Encounter for other preprocedural examination: Secondary | ICD-10-CM | POA: Insufficient documentation

## 2012-01-23 DIAGNOSIS — J8410 Pulmonary fibrosis, unspecified: Secondary | ICD-10-CM | POA: Insufficient documentation

## 2012-01-23 LAB — PROTIME-INR: Prothrombin Time: 13.9 seconds (ref 11.6–15.2)

## 2012-01-23 LAB — COMPREHENSIVE METABOLIC PANEL
ALT: 12 U/L (ref 0–53)
Alkaline Phosphatase: 69 U/L (ref 39–117)
BUN: 15 mg/dL (ref 6–23)
CO2: 24 mEq/L (ref 19–32)
GFR calc Af Amer: >90 mL/min (ref >90)
GFR calc non Af Amer: 88 mL/min — ABNORMAL LOW (ref >90)
Glucose, Bld: 109 mg/dL — ABNORMAL HIGH (ref 70–99)
Potassium: 4.2 mEq/L (ref 3.5–5.1)
Sodium: 139 mEq/L (ref 135–145)

## 2012-01-23 LAB — SURGICAL PCR SCREEN: Staphylococcus aureus: NEGATIVE

## 2012-01-23 LAB — URINALYSIS, ROUTINE W REFLEX MICROSCOPIC
Bilirubin Urine: NEGATIVE
Glucose, UA: NEGATIVE mg/dL
Hgb urine dipstick: NEGATIVE
Nitrite: NEGATIVE
Specific Gravity, Urine: 1.014 (ref 1.005–1.030)
pH: 8 (ref 5.0–8.0)

## 2012-01-23 LAB — CBC
HCT: 39.3 % (ref 39.0–52.0)
Hemoglobin: 13.3 g/dL (ref 13.0–17.0)
MCH: 31.4 pg (ref 26.0–34.0)
RBC: 4.24 MIL/uL (ref 4.22–5.81)

## 2012-01-23 LAB — BLOOD GAS, ARTERIAL
Bicarbonate: 25 mEq/L — ABNORMAL HIGH (ref 20.0–24.0)
FIO2: 0.21 %
O2 Saturation: 97.8 %
TCO2: 26.2 mmol/L (ref 0–100)
pO2, Arterial: 98.4 mmHg (ref 80.0–100.0)

## 2012-01-23 LAB — URINE MICROSCOPIC-ADD ON

## 2012-01-23 NOTE — Progress Notes (Addendum)
Pt here for preadmission.  Denies being dx'd w/ sleep apnea.  Reports having nuclear stress test@ Dr. Candie Chroman office(12/2011- in [facility]). Verbalizes back understanding of preop instructions.

## 2012-01-24 MED ORDER — DEXTROSE 5 % IV SOLN
1.5000 g | INTRAVENOUS | Status: AC
  Administered 2012-01-25: 1.5 g via INTRAVENOUS
  Filled 2012-01-24: qty 1.5

## 2012-01-25 ENCOUNTER — Encounter (HOSPITAL_COMMUNITY): Payer: Self-pay | Admitting: Surgery

## 2012-01-25 ENCOUNTER — Encounter (HOSPITAL_COMMUNITY): Payer: Self-pay | Admitting: Anesthesiology

## 2012-01-25 ENCOUNTER — Inpatient Hospital Stay (HOSPITAL_COMMUNITY)
Admission: RE | Admit: 2012-01-25 | Discharge: 2012-01-26 | DRG: 238 | Disposition: A | Discharge status: Home / Self Care | Payer: Medicare HMO | Source: Ambulatory Visit | Attending: Vascular Surgery | Admitting: Vascular Surgery

## 2012-01-25 ENCOUNTER — Inpatient Hospital Stay (HOSPITAL_COMMUNITY): Payer: Medicare HMO

## 2012-01-25 ENCOUNTER — Inpatient Hospital Stay (HOSPITAL_COMMUNITY): Payer: Medicare HMO | Admitting: Anesthesiology

## 2012-01-25 ENCOUNTER — Encounter (HOSPITAL_COMMUNITY)
Admission: RE | Disposition: A | Discharge status: Home / Self Care | Payer: Self-pay | Source: Ambulatory Visit | Attending: Vascular Surgery

## 2012-01-25 DIAGNOSIS — F411 Generalized anxiety disorder: Secondary | ICD-10-CM | POA: Diagnosis present

## 2012-01-25 DIAGNOSIS — E785 Hyperlipidemia, unspecified: Secondary | ICD-10-CM | POA: Diagnosis present

## 2012-01-25 DIAGNOSIS — I714 Abdominal aortic aneurysm, without rupture, unspecified: Principal | ICD-10-CM | POA: Diagnosis present

## 2012-01-25 DIAGNOSIS — I10 Essential (primary) hypertension: Secondary | ICD-10-CM | POA: Diagnosis present

## 2012-01-25 DIAGNOSIS — D649 Anemia, unspecified: Secondary | ICD-10-CM | POA: Diagnosis present

## 2012-01-25 DIAGNOSIS — E039 Hypothyroidism, unspecified: Secondary | ICD-10-CM | POA: Diagnosis present

## 2012-01-25 LAB — BASIC METABOLIC PANEL
BUN: 12 mg/dL (ref 6–23)
Calcium: 8.8 mg/dL (ref 8.4–10.5)
Creatinine, Ser: 0.66 mg/dL (ref 0.50–1.35)
GFR calc Af Amer: >90 mL/min (ref >90)
GFR calc non Af Amer: 87 mL/min — ABNORMAL LOW (ref >90)

## 2012-01-25 LAB — CBC
HCT: 34 % — ABNORMAL LOW (ref 39.0–52.0)
MCHC: 33.8 g/dL (ref 30.0–36.0)
MCV: 92.4 fL (ref 78.0–100.0)
RDW: 12.6 % (ref 11.5–15.5)

## 2012-01-25 SURGERY — INSERTION, ENDOVASCULAR STENT GRAFT, AORTA, ABDOMINAL
Anesthesia: General | Wound class: Clean

## 2012-01-25 MED ORDER — GUAIFENESIN-DM 100-10 MG/5ML PO SYRP: 15.0000 mL | ORAL_SOLUTION | ORAL | Status: DC | PRN

## 2012-01-25 MED ORDER — LABETALOL HCL 5 MG/ML IV SOLN: 10.0000 mg | INTRAVENOUS | Status: DC | PRN

## 2012-01-25 MED ORDER — LACTATED RINGERS IV SOLN
INTRAVENOUS | Status: DC | PRN
  Administered 2012-01-25: 07:00:00 via INTRAVENOUS

## 2012-01-25 MED ORDER — DOPAMINE-DEXTROSE 3.2-5 MG/ML-% IV SOLN: 3.0000 ug/kg/min | INTRAVENOUS | Status: DC | PRN

## 2012-01-25 MED ORDER — LACTATED RINGERS IV SOLN
INTRAVENOUS | Status: DC | PRN
  Administered 2012-01-25 (×2): via INTRAVENOUS

## 2012-01-25 MED ORDER — POTASSIUM CHLORIDE CRYS ER 20 MEQ PO TBCR: 20.0000 meq | EXTENDED_RELEASE_TABLET | Freq: Every day | ORAL | Status: DC | PRN

## 2012-01-25 MED ORDER — ACETAMINOPHEN 325 MG PO TABS
325.0000 mg | ORAL_TABLET | ORAL | Status: DC | PRN
  Administered 2012-01-25: 650 mg via ORAL
  Filled 2012-01-25: qty 2

## 2012-01-25 MED ORDER — METOPROLOL TARTRATE 1 MG/ML IV SOLN: 2.0000 mg | INTRAVENOUS | Status: DC | PRN

## 2012-01-25 MED ORDER — SODIUM CHLORIDE 0.9 % IV SOLN
500.0000 mL | Freq: Once | INTRAVENOUS | Status: AC | PRN
  Administered 2012-01-25: 500 mL via INTRAVENOUS

## 2012-01-25 MED ORDER — HYDRALAZINE HCL 20 MG/ML IJ SOLN: 10.0000 mg | INTRAMUSCULAR | Status: DC | PRN

## 2012-01-25 MED ORDER — POTASSIUM CHLORIDE IN NACL 20-0.9 MEQ/L-% IV SOLN
INTRAVENOUS | Status: DC
  Administered 2012-01-25: 16:00:00 via INTRAVENOUS
  Administered 2012-01-26: 100 mL/h via INTRAVENOUS
  Filled 2012-01-25 (×4): qty 1000

## 2012-01-25 MED ORDER — PHENYLEPHRINE HCL 10 MG/ML IJ SOLN
10.0000 mg | INTRAVENOUS | Status: DC | PRN
  Administered 2012-01-25: 20 ug/min via INTRAVENOUS

## 2012-01-25 MED ORDER — ONDANSETRON HCL 4 MG/2ML IJ SOLN: 4.0000 mg | Freq: Once | INTRAMUSCULAR | Status: DC | PRN

## 2012-01-25 MED ORDER — PHENOL 1.4 % MT LIQD: 1.0000 | OROMUCOSAL | Status: DC | PRN

## 2012-01-25 MED ORDER — LACTATED RINGERS IV SOLN
INTRAVENOUS | Status: DC | PRN
  Administered 2012-01-25 (×2): via INTRAVENOUS

## 2012-01-25 MED ORDER — OXYCODONE-ACETAMINOPHEN 5-325 MG PO TABS: 1.0000 | ORAL_TABLET | ORAL | Status: DC | PRN

## 2012-01-25 MED ORDER — HEPARIN SODIUM (PORCINE) 1000 UNIT/ML IJ SOLN
INTRAMUSCULAR | Status: DC | PRN
  Administered 2012-01-25: 2000 [IU] via INTRAVENOUS

## 2012-01-25 MED ORDER — IODIXANOL 320 MG/ML IV SOLN
INTRAVENOUS | Status: DC | PRN
  Administered 2012-01-25: 150 mL via INTRA_ARTERIAL

## 2012-01-25 MED ORDER — PROPRANOLOL HCL ER 80 MG PO CP24
80.0000 mg | ORAL_CAPSULE | Freq: Every day | ORAL | Status: DC
  Administered 2012-01-26: 80 mg via ORAL
  Filled 2012-01-25: qty 1

## 2012-01-25 MED ORDER — SUFENTANIL CITRATE 50 MCG/ML IV SOLN
INTRAVENOUS | Status: DC | PRN
  Administered 2012-01-25: 10 ug via INTRAVENOUS
  Administered 2012-01-25: 15 ug via INTRAVENOUS

## 2012-01-25 MED ORDER — PROPOFOL 10 MG/ML IV BOLUS
INTRAVENOUS | Status: DC | PRN
  Administered 2012-01-25: 80 mg via INTRAVENOUS

## 2012-01-25 MED ORDER — MEPERIDINE HCL 25 MG/ML IJ SOLN: 6.2500 mg | INTRAMUSCULAR | Status: DC | PRN

## 2012-01-25 MED ORDER — ACETAMINOPHEN 650 MG RE SUPP: 325.0000 mg | RECTAL | Status: DC | PRN

## 2012-01-25 MED ORDER — ONDANSETRON HCL 4 MG/2ML IJ SOLN
INTRAMUSCULAR | Status: DC | PRN
  Administered 2012-01-25: 4 mg via INTRAVENOUS

## 2012-01-25 MED ORDER — MIDAZOLAM HCL 5 MG/5ML IJ SOLN
INTRAMUSCULAR | Status: DC | PRN
  Administered 2012-01-25 (×2): 1 mg via INTRAVENOUS

## 2012-01-25 MED ORDER — GLYCOPYRROLATE 0.2 MG/ML IJ SOLN
INTRAMUSCULAR | Status: DC | PRN
  Administered 2012-01-25: .2 mg via INTRAVENOUS

## 2012-01-25 MED ORDER — PROTAMINE SULFATE 10 MG/ML IV SOLN
INTRAVENOUS | Status: DC | PRN
  Administered 2012-01-25: 50 mg via INTRAVENOUS

## 2012-01-25 MED ORDER — MORPHINE SULFATE 2 MG/ML IJ SOLN: 2.0000 mg | INTRAMUSCULAR | Status: DC | PRN

## 2012-01-25 MED ORDER — OXYCODONE HCL 5 MG PO TABS: 5.0000 mg | ORAL_TABLET | Freq: Once | ORAL | Status: DC | PRN

## 2012-01-25 MED ORDER — ROCURONIUM BROMIDE 100 MG/10ML IV SOLN
INTRAVENOUS | Status: DC | PRN
  Administered 2012-01-25: 60 mg via INTRAVENOUS
  Administered 2012-01-25: 10 mg via INTRAVENOUS
  Administered 2012-01-25: 20 mg via INTRAVENOUS

## 2012-01-25 MED ORDER — LIDOCAINE HCL 4 % MT SOLN
OROMUCOSAL | Status: DC | PRN
  Administered 2012-01-25: 4 mL via TOPICAL

## 2012-01-25 MED ORDER — LIDOCAINE HCL (CARDIAC) 20 MG/ML IV SOLN
INTRAVENOUS | Status: DC | PRN
  Administered 2012-01-25: 80 mg via INTRAVENOUS

## 2012-01-25 MED ORDER — EPHEDRINE SULFATE 50 MG/ML IJ SOLN
INTRAMUSCULAR | Status: DC | PRN
  Administered 2012-01-25: 5 mg via INTRAVENOUS

## 2012-01-25 MED ORDER — OXYCODONE HCL 5 MG/5ML PO SOLN: 5.0000 mg | Freq: Once | ORAL | Status: DC | PRN

## 2012-01-25 MED ORDER — HYDROCHLOROTHIAZIDE 50 MG PO TABS
50.0000 mg | ORAL_TABLET | Freq: Every day | ORAL | Status: DC
  Administered 2012-01-26: 50 mg via ORAL
  Filled 2012-01-25: qty 1

## 2012-01-25 MED ORDER — ONDANSETRON HCL 4 MG/2ML IJ SOLN: 4.0000 mg | Freq: Four times a day (QID) | INTRAMUSCULAR | Status: DC | PRN

## 2012-01-25 MED ORDER — MAGNESIUM SULFATE 40 MG/ML IJ SOLN
2.0000 g | Freq: Every day | INTRAMUSCULAR | Status: DC | PRN
  Filled 2012-01-25: qty 50

## 2012-01-25 MED ORDER — DOCUSATE SODIUM 100 MG PO CAPS
100.0000 mg | ORAL_CAPSULE | Freq: Every day | ORAL | Status: DC
  Administered 2012-01-26: 100 mg via ORAL
  Filled 2012-01-25: qty 1

## 2012-01-25 MED ORDER — HYDROMORPHONE HCL PF 1 MG/ML IJ SOLN: 0.2500 mg | INTRAMUSCULAR | Status: DC | PRN

## 2012-01-25 MED ORDER — DEXTROSE 5 % IV SOLN
1.5000 g | Freq: Two times a day (BID) | INTRAVENOUS | Status: AC
  Administered 2012-01-25 – 2012-01-26 (×2): 1.5 g via INTRAVENOUS
  Filled 2012-01-25 (×2): qty 1.5

## 2012-01-25 MED ORDER — ASPIRIN 325 MG PO TABS
325.0000 mg | ORAL_TABLET | Freq: Every day | ORAL | Status: DC
  Administered 2012-01-26: 325 mg via ORAL
  Filled 2012-01-25: qty 1

## 2012-01-25 MED ORDER — SODIUM CHLORIDE 0.9 % IV SOLN: INTRAVENOUS | Status: DC

## 2012-01-25 MED ORDER — SODIUM CHLORIDE 0.9 % IR SOLN
Status: DC | PRN
  Administered 2012-01-25 (×2)

## 2012-01-25 SURGICAL SUPPLY — 93 items
ADH SKN CLS APL DERMABOND .7 (GAUZE/BANDAGES/DRESSINGS) ×2
BAG BANDED W/RUBBER/TAPE 36X54 (MISCELLANEOUS) ×1 IMPLANT
BAG EQP BAND 135X91 W/RBR TAPE (MISCELLANEOUS) ×1
BAG SNAP BAND KOVER 36X36 (MISCELLANEOUS) ×4 IMPLANT
BALLN CODA OCL 2-9.0-35-120-3 (BALLOONS)
BALLOON COD OCL 2-9.0-35-120-3 (BALLOONS) IMPLANT
CANISTER SUCTION 2500CC (MISCELLANEOUS) ×2 IMPLANT
CATH BEACON 5.038 65CM KMP-01 (CATHETERS) ×2 IMPLANT
CATH OMNI FLUSH .035X70CM (CATHETERS) ×1 IMPLANT
CLIP TI MEDIUM 24 (CLIP) IMPLANT
CLIP TI WIDE RED SMALL 24 (CLIP) IMPLANT
CLOTH BEACON ORANGE TIMEOUT ST (SAFETY) ×2 IMPLANT
COVER DOME SNAP 22 D (MISCELLANEOUS) ×1 IMPLANT
COVER MAYO STAND STRL (DRAPES) ×2 IMPLANT
COVER PROBE W GEL 5X96 (DRAPES) ×2 IMPLANT
COVER SURGICAL LIGHT HANDLE (MISCELLANEOUS) ×2 IMPLANT
DERMABOND ADVANCED (GAUZE/BANDAGES/DRESSINGS) ×2
DERMABOND ADVANCED .7 DNX12 (GAUZE/BANDAGES/DRESSINGS) ×1 IMPLANT
DEVICE CLOSURE PERCLS PRGLD 6F (VASCULAR PRODUCTS) IMPLANT
DRAIN CHANNEL 10F 3/8 F FF (DRAIN) IMPLANT
DRAIN CHANNEL 10M FLAT 3/4 FLT (DRAIN) IMPLANT
DRAPE TABLE COVER HEAVY DUTY (DRAPES) ×2 IMPLANT
DRESSING OPSITE X SMALL 2X3 (GAUZE/BANDAGES/DRESSINGS) ×2 IMPLANT
DRYSEAL FLEXSHEATH 12FR 33CM (SHEATH) ×1
DRYSEAL FLEXSHEATH 18FR 33CM (SHEATH) ×1
ELECT CAUTERY BLADE 6.4 (BLADE) IMPLANT
ELECT REM PT RETURN 9FT ADLT (ELECTROSURGICAL) ×4
ELECTRODE REM PT RTRN 9FT ADLT (ELECTROSURGICAL) ×2 IMPLANT
EVACUATOR 3/16  PVC DRAIN (DRAIN)
EVACUATOR 3/16 PVC DRAIN (DRAIN) IMPLANT
EVACUATOR SILICONE 100CC (DRAIN) IMPLANT
EXCLUDER TRUNK (Endovascular Graft) ×1 IMPLANT
GAUZE SPONGE 2X2 8PLY STRL LF (GAUZE/BANDAGES/DRESSINGS) IMPLANT
GLOVE BIOGEL PI IND STRL 6.5 (GLOVE) IMPLANT
GLOVE BIOGEL PI IND STRL 7.5 (GLOVE) IMPLANT
GLOVE BIOGEL PI INDICATOR 6.5 (GLOVE) ×2
GLOVE BIOGEL PI INDICATOR 7.5 (GLOVE) ×1
GLOVE SS BIOGEL STRL SZ 7 (GLOVE) ×1 IMPLANT
GLOVE SS BIOGEL STRL SZ 7.5 (GLOVE) IMPLANT
GLOVE SUPERSENSE BIOGEL SZ 7 (GLOVE) ×1
GLOVE SUPERSENSE BIOGEL SZ 7.5 (GLOVE) ×1
GLOVE SURG SS PI 7.0 STRL IVOR (GLOVE) ×1 IMPLANT
GLOVE SURG SS PI 7.5 STRL IVOR (GLOVE) ×1 IMPLANT
GOWN PREVENTION PLUS XLARGE (GOWN DISPOSABLE) ×1 IMPLANT
GOWN STRL NON-REIN LRG LVL3 (GOWN DISPOSABLE) ×5 IMPLANT
GOWN STRL REIN 2XL XLG LVL4 (GOWN DISPOSABLE) ×1 IMPLANT
GOWN STRL REIN XL XLG (GOWN DISPOSABLE) ×1 IMPLANT
GRAFT BALLN CATH 65CM (STENTS) IMPLANT
GRAFT EXCLUDER LEG (Endovascular Graft) ×1 IMPLANT
GUIDEWIRE ANGLED .035X150CM (WIRE) ×1 IMPLANT
KIT BASIN OR (CUSTOM PROCEDURE TRAY) ×2 IMPLANT
KIT ROOM TURNOVER OR (KITS) ×2 IMPLANT
LEG CONTRALATERAL 16X16X13.5 (Endovascular Graft) ×2 IMPLANT
LEG CONTRALETERAL16X16X11.5 (Endovascular Graft) ×2 IMPLANT
NAMIC PROTECTION STATION ×2 IMPLANT
NEEDLE PERC 18GX7CM (NEEDLE) ×2 IMPLANT
NS IRRIG 1000ML POUR BTL (IV SOLUTION) ×2 IMPLANT
PACK AORTA (CUSTOM PROCEDURE TRAY) ×2 IMPLANT
PAD ARMBOARD 7.5X6 YLW CONV (MISCELLANEOUS) ×4 IMPLANT
PENCIL BUTTON HOLSTER BLD 10FT (ELECTRODE) IMPLANT
PERCLOSE PROGLIDE 6F (VASCULAR PRODUCTS) ×8
SHEATH AVANTI 11CM 8FR (MISCELLANEOUS) ×1 IMPLANT
SHEATH BRITE TIP 8FR 23CM (MISCELLANEOUS) ×2 IMPLANT
SHEATH DRYSEAL FLEX 12FR 33CM (SHEATH) IMPLANT
SHEATH DRYSEAL FLEX 18FR 33CM (SHEATH) IMPLANT
SPONGE GAUZE 2X2 STER 10/PKG (GAUZE/BANDAGES/DRESSINGS) ×2
STENT GRAFT BALLN CATH 65CM (STENTS) ×1
STENT GRAFT CONTRALAT 16X11.5 (Endovascular Graft) IMPLANT
STENT GRAFT CONTRALAT 16X13.5 (Endovascular Graft) IMPLANT
STOPCOCK MORSE 400PSI 3WAY (MISCELLANEOUS) ×2 IMPLANT
SUT ETHILON 3 0 PS 1 (SUTURE) IMPLANT
SUT PROLENE 5 0 C 1 24 (SUTURE) IMPLANT
SUT PROLENE 5 0 CC 1 (SUTURE) IMPLANT
SUT PROLENE 6 0 C 1 30 (SUTURE) IMPLANT
SUT VIC AB 2-0 CT1 27 (SUTURE)
SUT VIC AB 2-0 CT1 TAPERPNT 27 (SUTURE) IMPLANT
SUT VIC AB 3-0 SH 27 (SUTURE)
SUT VIC AB 3-0 SH 27X BRD (SUTURE) IMPLANT
SUT VICRYL 4-0 PS2 18IN ABS (SUTURE) ×4 IMPLANT
SYR 20CC LL (SYRINGE) ×4 IMPLANT
SYR 30ML LL (SYRINGE) IMPLANT
SYR 5ML LL (SYRINGE) IMPLANT
SYR MEDRAD MARK V 150ML (SYRINGE) ×2 IMPLANT
SYRINGE 10CC LL (SYRINGE) ×4 IMPLANT
TORQUE DEVICE ×1 IMPLANT
TOWEL OR 17X24 6PK STRL BLUE (TOWEL DISPOSABLE) ×4 IMPLANT
TOWEL OR 17X26 10 PK STRL BLUE (TOWEL DISPOSABLE) ×4 IMPLANT
TRAY FOLEY CATH 14FRSI W/METER (CATHETERS) ×2 IMPLANT
TUBING HIGH PRESSURE 120CM (CONNECTOR) ×2 IMPLANT
VANSCHIE5 BEACON TIP TORCON NB ADVANTAGE CATHETER ×1 IMPLANT
WATER STERILE IRR 1000ML POUR (IV SOLUTION) ×1 IMPLANT
WIRE AMPLATZ SS-J .035X180CM (WIRE) ×2 IMPLANT
WIRE BENTSON .035X145CM (WIRE) ×4 IMPLANT

## 2012-01-25 NOTE — Progress Notes (Signed)
At beginning of shift patient's bp 95/51 per cuff; A-line dampened but reading 81/45.  500 cc bolus being administered currently.  Will continue to monitor.    Vivi Martens RN

## 2012-01-25 NOTE — Anesthesia Postprocedure Evaluation (Signed)
Anesthesia Post Note  Patient: Jason Cameron  Procedure(s) Performed: Procedure(s) (LRB): ABDOMINAL AORTIC ENDOVASCULAR STENT GRAFT (N/A)  Anesthesia type: general  Patient location: PACU  Post pain: Pain level controlled  Post assessment: Patient's Cardiovascular Status Stable  Last Vitals:  Filed Vitals:   01/25/12 1200  BP:   Pulse: 50  Temp:   Resp: 13    Post vital signs: Reviewed and stable  Level of consciousness: sedated  Complications: No apparent anesthesia complications

## 2012-01-25 NOTE — Anesthesia Preprocedure Evaluation (Addendum)
Anesthesia Evaluation  Patient identified by MRN, date of birth, ID band Patient awake    Reviewed: Allergy & Precautions, H&P , NPO status , Patient's Chart, lab work & pertinent test results  History of Anesthesia Complications Negative for: history of anesthetic complications  Airway Mallampati: II TM Distance: >3 FB Neck ROM: Full    Dental  (+) Teeth Intact and Dental Advisory Given   Pulmonary neg pulmonary ROS,    Pulmonary exam normal       Cardiovascular hypertension, Pt. on medications Rhythm:Irregular Rate:Bradycardia     Neuro/Psych Essential tremor    GI/Hepatic negative GI ROS, Neg liver ROS,   Endo/Other    Renal/GU negative Renal ROS     Musculoskeletal negative musculoskeletal ROS (+)   Abdominal Normal abdominal exam  (+)   Peds  Hematology negative hematology ROS (+)   Anesthesia Other Findings   Reproductive/Obstetrics                          Anesthesia Physical Anesthesia Plan  ASA: III  Anesthesia Plan: General   Post-op Pain Management:    Induction: Intravenous  Airway Management Planned: Oral ETT  Additional Equipment: Arterial line and CVP  Intra-op Plan:   Post-operative Plan: Extubation in OR  Informed Consent: I have reviewed the patients History and Physical, chart, labs and discussed the procedure including the risks, benefits and alternatives for the proposed anesthesia with the patient or authorized representative who has indicated his/her understanding and acceptance.   Dental advisory given  Plan Discussed with: CRNA and Surgeon  Anesthesia Plan Comments:        Anesthesia Quick Evaluation

## 2012-01-25 NOTE — Transfer of Care (Signed)
Immediate Anesthesia Transfer of Care Note  Patient: Jason Cameron  Procedure(s) Performed: Procedure(s) (LRB) with comments: ABDOMINAL AORTIC ENDOVASCULAR STENT GRAFT (N/A) - EVAR- Gore  Patient Location: PACU  Anesthesia Type: General  Level of Consciousness: awake, alert , oriented and patient cooperative  Airway & Oxygen Therapy: Patient Spontanous Breathing and Patient connected to face mask oxygen  Post-op Assessment: Report given to PACU RN, Post -op Vital signs reviewed and stable, Patient moving all extremities and Patient moving all extremities X 4  Post vital signs: Reviewed and stable  Complications: No apparent anesthesia complications

## 2012-01-25 NOTE — Progress Notes (Signed)
Lunch relief provided. 

## 2012-01-25 NOTE — Plan of Care (Signed)
Problem: Consults Goal: Diagnosis CEA/CES/AAA Stent aaa stent

## 2012-01-25 NOTE — Progress Notes (Signed)
01/25/2012 3:22 PM Patient arrived from pacu. Vss. No complaints of pain. Pulses palpable and surgical site is wnl. Family at bedside. Will continue to monitor. Celesta Gentile

## 2012-01-25 NOTE — Progress Notes (Signed)
BP 104/44 post 500 cc bolus; A-line currently reading 110/45.  Will continue to monitor.  Vivi Martens RN

## 2012-01-25 NOTE — Op Note (Signed)
[facility] [place]  Patient name: Jason Cameron MRN: [id number] DOB: [birth year] Sex: male  01/25/2012 Pre-operative Diagnosis: AAA Post-operative diagnosis:  Same Surgeon:  Jorge Ny Co-Surgeon:  Betti Cruz Procedure:   EVAR Anesthesia:  GEN Blood Loss:  See anesthesia record Specimens:  none  Findings:  Complete exclusion    Procedure:  The patient was identified in the holding area and taken to [facility] OR ROOM 16  The patient was then placed supine on the table. general anesthesia was administered.  The patient was prepped and draped in the usual sterile fashion.  A time out was called and antibiotics were administered.  Bilateral percutaneous access was established.  Wires were placed into the aorta from each access point.  The mainbody was deployed from the left side.  The ipsilateral limb was completed and a distal extension was placed.  A bridging piece on the contralateral side was placed followed by a contralateral extension.  The device was molded with a Q50 baloon and a completion aortogram was performed which revealed complete exclusion with a delayed type 2 endoleak.  The groins were successfully closed.  Please see Dr. Candie Chroman Op note for complete details.   Disposition:  To PACU in stable condition.   Juleen China, M.D. [facility] [place] Office: [phone] Pager:  [phone]

## 2012-01-25 NOTE — Op Note (Signed)
OPERATIVE REPORT  Date of Surgery: 01/25/2012  Surgeon: Josephina Gip, MD  Assistant: Durene Cal M.D.  Pre-op Diagnosis: Abdominal Aortic Aneurysm  Post-op Diagnosis: Abdominal Aortic Aneurysm  Procedure: Procedure(s): #1 bilateral femoral artery accessed via percutaneous route #2 insertion aorta by common iliac Gore excluder-C3 stent graft using       23 x 14 x 18 cm main body device-left       16 x 11.5 cm limb-left       14 x 10 cm limb-contra-right        16 x 13.5 cm limb-contrast-right-extender #3 completion angiography #4 bilateral femoral artery repair using Pro-glide device x4  Anesthesia: General  EBL: 200 cc  Complications: None  Procedure Details: Patient to the operating room placed in supine position at which time satisfactory general endotracheal anesthesia was measured. The abdomen and groins were prepped Betadine scrub and solution draped in routine sterile manner. Using ultrasound guidance both common femoral arteries were punctured and using a Pro-glide device at the 11:00 and 1:00 position on each puncture site guidewire was placed and long 8 French sheath placed on the right a short 8 French sheath passed on the left. The main body was to be delivered through the left side. Pigtail catheter was passed up the right side positioned in the supra-renal aorta. The [facility] wire was then exchanged for an Amplatz wire using a Kumpe catheter on the left and the sheath exchanged for an 18 French sheath. The patient had been heparinized using 6000 units of heparin following insertion of the sheaths. The main body of the device-C3-excluder-Gore was inserted through the sheath on the left side positioned in the infrarenal aorta. Angiogram was performed injecting 15 cc of contrast at 15 cc per second to localize the level of the renal arteries precisely. Device was then deployed just distal to the renal arteries. Second angiogram was performed which revealed excellent position of  the graft. He was employed down to the contralateral gate. The 8 French sheath in the right was exchanged for a 12 French sheath and using the piggyback technique with a Amplatz and Benson wire the gate was cannulated pigtail catheter inserted to confirm accurate can't utilization of the gait. Following this the left leg was completed. A retrograde angiogram was performed through the sheath to get precise measurement of the iliac extender. A 16 x 11.5 cm extender was utilized deployed down to just proximal to the hypogastric on the left. Following this attention was returned to the contralateral gate and initially a 14 x 10 cm extender was deployed. Following this a retrograde angiogram was performed through the sheath, right 2 localize the level of the hypogastric and a 16 x 13.5 cm iliac extender on the right was deployed just proximal to the hypogastric origin. Following this all junctions were dilated with acute 50 balloon. Completion angiogram was performed the pigtail catheter was no evidence of endoleak. There is excellent position with good filling of both renal arteries. She should then removed and the femoral arteries were repaired using the Pro-glide devices with excellent hemostasis guidewires removed protamine given to reverse the heparin wound closed with Vicryl in subcuticular fashion plus Dermabond patient to the recovery room in stable condition    Josephina Gip, MD 01/25/2012 11:26 AM

## 2012-01-25 NOTE — H&P (View-Only) (Signed)
Subjective:     Patient ID: Jason Cameron, male   DOB: Jul 21, 1925, 78 y.o.   MRN: [id number]  HPI this 78 year old male returns for further discussion regarding his infrarenal abdominal aortic aneurysm. He had a Cardiolite study performed which revealed a good ejection fraction no evidence of ischemia. He has had no abdominal or back symptoms.   Past Medical History  Diagnosis Date  . Hypertension   . Anxiety   . AAA (abdominal aortic aneurysm)     History  Substance Use Topics  . Smoking status: Former Smoker -- 20 years    Types: Cigarettes    Quit date: 12/25/1981  . Smokeless tobacco: Never Used  . Alcohol Use: No    Family History  Problem Relation Age of Onset  . COPD Mother     Allergies  Allergen Reactions  . Sulfonamide Derivatives     Current outpatient prescriptions:diclofenac (VOLTAREN) 75 MG EC tablet, Take 1 tablet (75 mg total) by mouth 2 (two) times daily., Disp: 60 tablet, Rfl: 4;  hydrochlorothiazide (HYDRODIURIL) 50 MG tablet, Take 1 tablet (50 mg total) by mouth daily., Disp: 90 tablet, Rfl: 3;  ibuprofen (ADVIL,MOTRIN) 100 MG tablet, Take 100 mg by mouth every 6 (six) hours as needed., Disp: , Rfl:  propranolol ER (INDERAL LA) 80 MG 24 hr capsule, Take 1 capsule (80 mg total) by mouth daily., Disp: 90 capsule, Rfl: 3 No current facility-administered medications for this visit. Facility-Administered Medications Ordered in Other Visits: iohexol (OMNIPAQUE) 350 MG/ML injection 100 mL, 100 mL, Intravenous, Once PRN, Medication Radiologist, MD, 100 mL at 01/17/12 0949  BP 123/73  Pulse 69  Resp 20  Ht 5\' 10"  (1.778 m)  Wt 143 lb (64.864 kg)  BMI 20.52 kg/m2  Body mass index is 20.52 kg/(m^2).           Review of Systems denies chest pain, dyspnea on exertion, PND, orthopnea. Does have occasional dizziness and headache complains of varicose veins. Other systems negative and complete review of systems    Objective:   Physical Exam blood  pressure 123/73 heart rate 69 respirations 20 Gen.-alert and oriented x3 in no apparent distress HEENT normal for age Lungs no rhonchi or wheezing Cardiovascular regular rhythm no murmurs carotid pulses 3+ palpable no bruits audible Abdomen soft nontender -5 cm pulsatile mass  Musculoskeletal free of  major deformities Skin clear -no rashes Neurologic normal Lower extremities 3+ femoral and dorsalis pedis pulses palpable bilaterally with no edema  Data ordered a CT angiogram which I reviewed the computer. He does have a 5 cm infrarenal aortic aneurysm. There is a sharp 90 angulation of the neck just below the renal arteries but when the neck straightens it is consistently smoothed for about 3 cm before the aneurysm begins. Common iliac arteries are unremarkable.     Assessment:     Infrarenal abdominal aortic aneurysm with tortuous angulated infrarenal neck-longneck below this-I think patient will be candidate for an endograft    Plan:      schedule insertion of a Gore excluder stent graft on Wednesday, September 18. Risks and benefits and potential for open repair discussed with patient and his son and they are agreeable and would like to proceed as soon as we can schedule.

## 2012-01-25 NOTE — Progress Notes (Signed)
Utilization review completed.  

## 2012-01-25 NOTE — Interval H&P Note (Signed)
History and Physical Interval Note:  01/25/2012 8:22 AM  Jason Cameron  has presented today for surgery, with the diagnosis of Aortic Abdominal Aneurysm  The various methods of treatment have been discussed with the patient and family. After consideration of risks, benefits and other options for treatment, the patient has consented to  Procedure(s) (LRB) with comments: ABDOMINAL AORTIC ENDOVASCULAR STENT GRAFT (N/A) - EVAR- Gore as a surgical intervention .  The patient's history has been reviewed, patient examined, no change in status, stable for surgery.  I have reviewed the patient's chart and labs.  Questions were answered to the patient's satisfaction.     Josephina Gip

## 2012-01-26 ENCOUNTER — Telehealth: Payer: Self-pay | Admitting: Vascular Surgery

## 2012-01-26 LAB — BASIC METABOLIC PANEL
BUN: 9 mg/dL (ref 6–23)
Calcium: 8.5 mg/dL (ref 8.4–10.5)
Creatinine, Ser: 0.7 mg/dL (ref 0.50–1.35)
GFR calc non Af Amer: 85 mL/min — ABNORMAL LOW (ref >90)
Glucose, Bld: 105 mg/dL — ABNORMAL HIGH (ref 70–99)
Potassium: 3.6 mEq/L (ref 3.5–5.1)

## 2012-01-26 LAB — CBC
HCT: 31.9 % — ABNORMAL LOW (ref 39.0–52.0)
Hemoglobin: 10.8 g/dL — ABNORMAL LOW (ref 13.0–17.0)
MCH: 31.6 pg (ref 26.0–34.0)
MCHC: 33.9 g/dL (ref 30.0–36.0)
MCV: 93.3 fL (ref 78.0–100.0)

## 2012-01-26 NOTE — Progress Notes (Signed)
Patient has been discharged home. Son present and reviewed discharge instructions with son and patient. No further questions or concerns voiced. Both IV's have been D/C

## 2012-01-26 NOTE — Telephone Encounter (Signed)
I am unable to schedule cta appt until pt is discharged. LM for pt regarding the fu appts and informed him that I would call as soon as I have the cta scheduled. awt

## 2012-01-26 NOTE — Progress Notes (Signed)
Patient's cuff BP 87/44; A line reading 101/55.  Administering second 500 cc bolus.  Will continue to monitor.  Vivi Martens RN

## 2012-01-26 NOTE — Progress Notes (Addendum)
[facility] Progress Note  01/26/2012 7:20 AM POD 1  Subjective:  No complaints  90s-110s since yesterday afternoon-required 2 fluid boluses. HR  50s-60s reg Afebrile 100%2LO2NC Filed Vitals:   01/26/12 0630  BP: 109/47  Pulse: 57  Temp:   Resp: 21    Physical Exam: Incisions:  Bilateral groins are soft without hematoma Abdomen:  Soft NT/ND Cardiac:  RRR Lungs:  CTAB Extremities:  + palpable DP bilaterally  CBC    Component Value Date/Time   WBC 8.0 01/26/2012 0410   WBC 7.5 12/12/2011 0950   RBC 3.42* 01/26/2012 0410   RBC 4.90 12/12/2011 0950   HGB 10.8* 01/26/2012 0410   HGB 14.9 12/12/2011 0950   HCT 31.9* 01/26/2012 0410   HCT 48.5 12/12/2011 0950   PLT 93* 01/26/2012 0410   MCV 93.3 01/26/2012 0410   MCV 98.9* 12/12/2011 0950   MCH 31.6 01/26/2012 0410   MCH 30.4 12/12/2011 0950   MCHC 33.9 01/26/2012 0410   MCHC 30.7* 12/12/2011 0950   RDW 12.8 01/26/2012 0410   LYMPHSABS 1.5 11/09/2010 1130   MONOABS 0.7 11/09/2010 1130   EOSABS 0.4 11/09/2010 1130   BASOSABS 0.0 11/09/2010 1130    BMET    Component Value Date/Time   NA 140 01/26/2012 0410   K 3.6 01/26/2012 0410   CL 105 01/26/2012 0410   CO2 29 01/26/2012 0410   GLUCOSE 105* 01/26/2012 0410   BUN 9 01/26/2012 0410   CREATININE 0.70 01/26/2012 0410   CREATININE 1.00 12/12/2011 0950   CALCIUM 8.5 01/26/2012 0410   GFRNONAA 85* 01/26/2012 0410   GFRAA >90 01/26/2012 0410    INR    Component Value Date/Time   INR 1.24 01/25/2012 1105     Intake/Output Summary (Last 24 hours) at 01/26/12 0720 Last data filed at 01/26/12 0600  Gross per 24 hour  Intake   4750 ml  Output   3750 ml  Net   1000 ml     Assessment/Plan:  78 y.o. male is s/p EVAR POD 1  -pt doing well this am. -BUN/Cr WNL this am and pt has good UOP. -d/c foley -pt will need to ambulate and void before discharge. -f/u 1 month with Dr. Shawnie Dapper after CTA  Doreatha Massed, PA-C [facility] [phone] 01/26/2012 7:20 AM   Agree with above Abdomen soft nontender no pulsatile mass palpable Inguinal wounds look good no hematoma 3+ dorsalis pedis pulses palpable bilaterally Blood work all stable  Plan DC home today after patient voids. Will return to office in 4 weeks with CT angiogram for followup

## 2012-01-26 NOTE — Telephone Encounter (Signed)
Message copied by Shari Prows on Thu Jan 26, 2012  3:42 PM ------      Message from: Melene Plan      Created: Wed Jan 25, 2012  1:03 PM                   ----- Message -----         From: Marlowe Shores, PA         Sent: 01/25/2012  11:14 AM           To: Melene Plan, RN            4 week F/U - lawson with CTA abd Darrold Junker - EVAR

## 2012-01-26 NOTE — Discharge Summary (Signed)
[facility] Discharge Summary  Jason Cameron Feb 19, 1926 78 y.o. male  [id number]  Admission Date: 01/25/2012  Discharge Date: 01/26/12  Physician: Pryor Ochoa, MD  Admission Diagnosis: Aortic Abdominal Aneurysm   HPI:   This is a 78 y.o. male returns for further discussion regarding his infrarenal abdominal aortic aneurysm. He had a Cardiolite study performed which revealed a good ejection fraction no evidence of ischemia. He has had no abdominal or back symptoms.   Hospital Course:  The patient was admitted to the hospital and taken to the operating room on 01/25/2012 and underwent EVAR.  The pt tolerated the procedure well and was transported to the PACU in good condition. By POD 1, he is doing well.  He did require 2 fluid boluses for systolic BP in the 80s and 90s, and this is improved.  The remainder of the hospital course consisted of increasing mobilization and increasing intake of solids without difficulty.  CBC    Component Value Date/Time   WBC 8.0 01/26/2012 0410   WBC 7.5 12/12/2011 0950   RBC 3.42* 01/26/2012 0410   RBC 4.90 12/12/2011 0950   HGB 10.8* 01/26/2012 0410   HGB 14.9 12/12/2011 0950   HCT 31.9* 01/26/2012 0410   HCT 48.5 12/12/2011 0950   PLT 93* 01/26/2012 0410   MCV 93.3 01/26/2012 0410   MCV 98.9* 12/12/2011 0950   MCH 31.6 01/26/2012 0410   MCH 30.4 12/12/2011 0950   MCHC 33.9 01/26/2012 0410   MCHC 30.7* 12/12/2011 0950   RDW 12.8 01/26/2012 0410   LYMPHSABS 1.5 11/09/2010 1130   MONOABS 0.7 11/09/2010 1130   EOSABS 0.4 11/09/2010 1130   BASOSABS 0.0 11/09/2010 1130    BMET    Component Value Date/Time   NA 140 01/26/2012 0410   K 3.6 01/26/2012 0410   CL 105 01/26/2012 0410   CO2 29 01/26/2012 0410   GLUCOSE 105* 01/26/2012 0410   BUN 9 01/26/2012 0410   CREATININE 0.70 01/26/2012 0410   CREATININE 1.00 12/12/2011 0950   CALCIUM 8.5 01/26/2012 0410   GFRNONAA 85* 01/26/2012 0410   GFRAA >90 01/26/2012 0410     Discharge Instructions:     The patient is discharged to home with extensive instructions on wound care and progressive ambulation.  They are instructed not to drive or perform any heavy lifting until returning to see the physician in his office.  Discharge Orders    Future Appointments: Provider: Department: Dept Phone: Center:   02/28/2012 11:45 AM Pryor Ochoa, MD [facility] [phone] [facility]     Future Orders Please Complete By Expires   Resume previous diet      Driving Restrictions      Comments:   No driving for 2 weeks   Lifting restrictions      Comments:   No lifting for 6 weeks   Call MD for:  temperature >100.5      Call MD for:  redness, tenderness, or signs of infection (pain, swelling, bleeding, redness, odor or green/yellow discharge around incision site)      Call MD for:  severe or increased pain, loss or decreased feeling  in affected limb(s)      Increase activity slowly      Comments:   Walk with assistance use walker or cane as needed   May shower       Scheduling Instructions:   Friday   ABDOMINAL PROCEDURE/ANEURYSM REPAIR/AORTO-BIFEMORAL BYPASS:  Call MD for increased abdominal pain; cramping  diarrhea; nausea/vomiting      Remove dressing in 24 hours         Discharge Diagnosis:  Aortic Abdominal Aneurysm  Secondary Diagnosis: Patient Active Problem List  Diagnosis  . HYPOTHYROIDISM  . VITAMIN D DEFICIENCY  . HYPERLIPIDEMIA  . ANEMIA  . ANXIETY, CHRONIC  . HYPERTENSION  . FREQUENCY, URINARY  . UNS ADVRS EFF OTH RX MEDICINAL&BIOLOGICAL SBSTNC  . AAA (abdominal aortic aneurysm)  . Abdominal aneurysm without mention of rupture   Past Medical History  Diagnosis Date  . Hypertension   . Anxiety   . AAA (abdominal aortic aneurysm)       Jason Cameron, Jason Cameron  Home Medication Instructions ZOX:[id number]   Printed on:01/26/12 0732  Medication Information                    ibuprofen (ADVIL,MOTRIN) 200 MG tablet Take 200 mg by mouth every 6 (six) hours as needed. For  pain           propranolol ER (INDERAL LA) 80 MG 24 hr capsule Take 80 mg by mouth daily.           hydrochlorothiazide (HYDRODIURIL) 50 MG tablet Take 50 mg by mouth daily.           diclofenac (VOLTAREN) 75 MG EC tablet Take 75 mg by mouth 2 (two) times daily as needed. Knee pain           aspirin 325 MG tablet Take 325 mg by mouth daily.           oxyCODONE-acetaminophen (ROXICET) 5-325 MG per tablet Take 1 tablet by mouth every 4 (four) hours as needed for pain. #30 NR            Disposition: home  Patient's condition: is Good  Follow up: 1. Dr. Hart Rochester in 4 weeks with CTA   Doreatha Massed, PA-C [facility] [phone] 01/26/2012  7:32 AM

## 2012-01-26 NOTE — Progress Notes (Signed)
Right IJ cordis dc'd without complications; Leta Baptist, RN also at bedside when pulled.  VSS, patient currently lying flat.  Will continue to monitor.  Vivi Martens RN

## 2012-01-26 NOTE — Progress Notes (Signed)
Foley catheter has been D/C at this time. Dr. Hart Rochester in to see the patient and was made aware of the foley still in and that it would be D/C at this time.Patient should be able to go home once he voids.

## 2012-01-26 NOTE — Progress Notes (Signed)
Cuff bp is 95/44 post second bolus; a line is reading 106/80.  Will continue to monitor.  Vivi Martens RN

## 2012-01-27 ENCOUNTER — Other Ambulatory Visit: Payer: Self-pay | Admitting: *Deleted

## 2012-01-27 ENCOUNTER — Other Ambulatory Visit: Payer: Self-pay | Admitting: Vascular Surgery

## 2012-01-27 DIAGNOSIS — I714 Abdominal aortic aneurysm, without rupture: Secondary | ICD-10-CM

## 2012-01-27 DIAGNOSIS — Z48812 Encounter for surgical aftercare following surgery on the circulatory system: Secondary | ICD-10-CM

## 2012-01-27 LAB — TYPE AND SCREEN

## 2012-01-31 ENCOUNTER — Telehealth: Payer: Self-pay

## 2012-01-31 NOTE — Telephone Encounter (Signed)
Pt. called to report problems with constipation.  Stated he has not had bowel movement since prior to surgery on 01/25/12.   Denies abdominal pain, distension/bloating, nausea or vomiting.  Denies any results after increasing fiber and taking Miralax.  States he is passing gas.  Advised pt. to take MOM 1 oz., and increase his water/ fluid intake, avoiding caffeinated beverages. Also advised to stay mobile to improve his bowel function.  Encouraged to call office if no results, and to report worsening symptoms; ie abdominal pain, nausea/vomiting, abdominal distension.  Verb. Understanding.

## 2012-02-27 ENCOUNTER — Encounter: Payer: Self-pay | Admitting: Vascular Surgery

## 2012-02-28 ENCOUNTER — Ambulatory Visit (INDEPENDENT_AMBULATORY_CARE_PROVIDER_SITE_OTHER): Payer: Medicare HMO | Admitting: Vascular Surgery

## 2012-02-28 ENCOUNTER — Encounter: Payer: Self-pay | Admitting: Vascular Surgery

## 2012-02-28 ENCOUNTER — Ambulatory Visit
Admission: RE | Admit: 2012-02-28 | Discharge: 2012-02-28 | Disposition: A | Discharge status: Home / Self Care | Payer: Medicare HMO | Source: Ambulatory Visit | Attending: Vascular Surgery | Admitting: Vascular Surgery

## 2012-02-28 ENCOUNTER — Ambulatory Visit: Payer: Medicare HMO | Admitting: Vascular Surgery

## 2012-02-28 VITALS — BP 135/75 | HR 65 | Resp 16 | Ht 70.0 in | Wt 146.0 lb

## 2012-02-28 DIAGNOSIS — Z48812 Encounter for surgical aftercare following surgery on the circulatory system: Secondary | ICD-10-CM

## 2012-02-28 DIAGNOSIS — I714 Abdominal aortic aneurysm, without rupture: Secondary | ICD-10-CM

## 2012-02-28 MED ORDER — IOHEXOL 350 MG/ML SOLN
80.0000 mL | Freq: Once | INTRAVENOUS | Status: AC | PRN
  Administered 2012-02-28: 80 mL via INTRAVENOUS

## 2012-02-28 NOTE — Progress Notes (Signed)
Subjective:     Patient ID: Jason Cameron, male   DOB: 1926/01/19, 78 y.o.   MRN: [id number]  HPI this 78 year old male is 4 weeks post aortic stent grafting of infrarenal abdominal aortic aneurysm using a Gore C3 excluder graft. Patient has done well over the past 4 weeks with a good appetite and normal bowel habits. Has had no chills and fever. He is ambulating normally.  Past Medical History  Diagnosis Date  . Hypertension   . Anxiety   . AAA (abdominal aortic aneurysm)     History  Substance Use Topics  . Smoking status: Former Smoker -- 0.5 packs/day for 20 years    Types: Cigarettes    Quit date: 12/25/1961  . Smokeless tobacco: Never Used  . Alcohol Use: No    Family History  Problem Relation Age of Onset  . COPD Mother     Allergies  Allergen Reactions  . Sulfonamide Derivatives Other (See Comments)    Childhood reaction     Current outpatient prescriptions:aspirin 325 MG tablet, Take 325 mg by mouth daily., Disp: , Rfl: ;  diclofenac (VOLTAREN) 75 MG EC tablet, Take 75 mg by mouth 2 (two) times daily as needed. Knee pain, Disp: , Rfl: ;  hydrochlorothiazide (HYDRODIURIL) 50 MG tablet, Take 50 mg by mouth daily., Disp: , Rfl: ;  ibuprofen (ADVIL,MOTRIN) 200 MG tablet, Take 200 mg by mouth every 6 (six) hours as needed. For pain, Disp: , Rfl:  oxyCODONE-acetaminophen (ROXICET) 5-325 MG per tablet, Take 1 tablet by mouth every 4 (four) hours as needed for pain., Disp: 30 tablet, Rfl: 0;  propranolol ER (INDERAL LA) 80 MG 24 hr capsule, Take 80 mg by mouth daily., Disp: , Rfl:  No current facility-administered medications for this visit. Facility-Administered Medications Ordered in Other Visits: iohexol (OMNIPAQUE) 350 MG/ML injection 80 mL, 80 mL, Intravenous, Once PRN, Medication Radiologist, MD, 80 mL at 02/28/12 1107  BP 135/75  Pulse 65  Resp 16  Ht 5\' 10"  (1.778 m)  Wt 146 lb (66.225 kg)  BMI 20.95 kg/m2  Body mass index is 20.95  kg/(m^2).          Review of Systems complains of right knee discomfort. Denies chest pain or dyspnea on exertion. No hemoptysis.     Objective:   Physical Exam blood pressure 130/75 heart rate 65 respirations 16 General well-developed well-nourished male no apparent stress alert and oriented x3 Lungs no rhonchi or wheezing Cardiovascular regular rhythm no murmurs carotid pulses are plus no audible bruits Abdomen soft nontender no pulsatile mass noted Extremities 3+ femoral and dorsalis pedis pulses palpable bilaterally.  Today I ordered a CT angiogram which are reviewed by computer. Stent graft is in excellent position there is no evidence of endoleak.    Assessment:     Doing well post endovascular repair of abdominal aortic aneurysm with no evidence of Endo leak    Plan:     Return in 6 months for repeat CT angiogram for further monitoring of aortic stent graft

## 2012-02-29 ENCOUNTER — Encounter: Payer: Self-pay | Admitting: Vascular Surgery

## 2012-02-29 NOTE — Addendum Note (Signed)
Addended by: Sharee Pimple on: 02/29/2012 08:43 AM   Modules accepted: Orders

## 2012-03-14 ENCOUNTER — Ambulatory Visit: Payer: Self-pay | Admitting: Emergency Medicine

## 2012-08-28 ENCOUNTER — Inpatient Hospital Stay: Admission: RE | Admit: 2012-08-28 | Payer: Medicare HMO | Source: Ambulatory Visit

## 2012-08-28 ENCOUNTER — Ambulatory Visit: Payer: Medicare HMO | Admitting: Vascular Surgery

## 2012-12-19 ENCOUNTER — Ambulatory Visit (INDEPENDENT_AMBULATORY_CARE_PROVIDER_SITE_OTHER): Payer: Medicare HMO | Admitting: Family Medicine

## 2012-12-19 VITALS — BP 130/80 | HR 59 | Temp 97.3°F | Resp 15 | Ht 70.0 in | Wt 157.4 lb

## 2012-12-19 DIAGNOSIS — F411 Generalized anxiety disorder: Secondary | ICD-10-CM

## 2012-12-19 DIAGNOSIS — I10 Essential (primary) hypertension: Secondary | ICD-10-CM

## 2012-12-19 MED ORDER — PROPRANOLOL HCL ER 80 MG PO CP24: 80.0000 mg | ORAL_CAPSULE | Freq: Every day | ORAL | Status: DC

## 2012-12-19 NOTE — Patient Instructions (Signed)
Check your blood pressure at home about once a month. We want your blood pressure to be <120/80.  If it is higher than this, come back to clinic so we can adjust your blood pressure medication.   At your next visit, morning time appointment with nothing to eat or drink other than water or black coffee for 6-8 hours before you visit. Do take your blood pressure medication that morning however.

## 2013-04-11 IMAGING — NM NM MISC PROCEDURE
1 series · 12 of 12 positions shown · non-contrast
Comparison: none

[Series 1: rest raw · 6.40mm/px · 2 acquisitions, 12 frames shown]
[im 1/2]
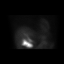
[im 1/2]
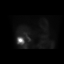
[im 1/2]
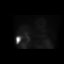
[im 1/2]
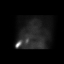
[im 1/2]
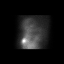
[im 1/2]
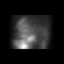
[im 2/2]
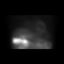
[im 2/2]
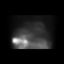
[im 2/2]
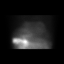
[im 2/2]
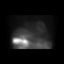
[im 2/2]
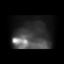
[im 2/2]
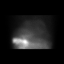

[12 of 12 positions shown; findings below may reference images not displayed]

Canned report from images found in remote index.

Refer to host system for actual result text.

## 2013-06-12 NOTE — Progress Notes (Signed)
   Subjective:    Patient ID: Jason Cameron, male    DOB: May 23, 1925, 80 y.o.   MRN: [id number] Chief Complaint  Patient presents with  . Medication Refill    propranolol    HPI  Has eaten today.  Is totally out of medication. Not checking BP outside of office. No CP/tightness, no palp. Denies HAs, vision change, or orthostatic sxs.  Past Medical History  Diagnosis Date  . Hypertension   . Anxiety   . AAA (abdominal aortic aneurysm)   . Cataract    Current Outpatient Prescriptions on File Prior to Visit  Medication Sig Dispense Refill  . aspirin 325 MG tablet Take 325 mg by mouth daily.      Marland Kitchen. ibuprofen (ADVIL,MOTRIN) 200 MG tablet Take 200 mg by mouth every 6 (six) hours as needed. For pain      . oxyCODONE-acetaminophen (ROXICET) 5-325 MG per tablet Take 1 tablet by mouth every 4 (four) hours as needed for pain.  30 tablet  0   No current facility-administered medications on file prior to visit.   Allergies  Allergen Reactions  . Sulfonamide Derivatives Other (See Comments)    Childhood reaction     Review of Systems  Constitutional: Negative for fever and chills.  Eyes: Negative for visual disturbance.  Respiratory: Negative for shortness of breath.   Cardiovascular: Negative for chest pain and leg swelling.  Neurological: Negative for dizziness, syncope, facial asymmetry, weakness, light-headedness and headaches.      BP 130/80  Pulse 59  Temp(Src) 97.3 F (36.3 C) (Oral)  Resp 15  Ht 5\' 10"  (1.778 m)  Wt 157 lb 6.4 oz (71.396 kg)  BMI 22.58 kg/m2  SpO2 98% Objective:   Physical Exam  Constitutional: He is oriented to person, place, and time. He appears well-developed and well-nourished. No distress.  HENT:  Head: Normocephalic and atraumatic.  Eyes: Conjunctivae are normal. Pupils are equal, round, and reactive to light. No scleral icterus.  Neck: Normal range of motion. Neck supple. No thyromegaly present.  Cardiovascular: Normal rate, regular  rhythm, normal heart sounds and intact distal pulses.   Pulmonary/Chest: Effort normal and breath sounds normal. No respiratory distress.  Musculoskeletal: He exhibits no edema.  Lymphadenopathy:    He has no cervical adenopathy.  Neurological: He is alert and oriented to person, place, and time.  Skin: Skin is warm and dry. He is not diaphoretic.  Psychiatric: He has a normal mood and affect. His behavior is normal.      Assessment & Plan:   HYPERTENSION - check bp outside office, record, and bring to f/u. If elev, call or RTC.  Rec fasting at next visit for labs but make sure to take med prior to visit.  ANXIETY, CHRONIC  Meds ordered this encounter  Medications  . propranolol ER (INDERAL LA) 80 MG 24 hr capsule    Sig: Take 1 capsule (80 mg total) by mouth daily.    Dispense:  90 capsule    Refill:  3    Norberto SorensonEva Shaw, MD MPH

## 2013-12-22 ENCOUNTER — Ambulatory Visit (INDEPENDENT_AMBULATORY_CARE_PROVIDER_SITE_OTHER): Payer: Medicare HMO | Admitting: Family Medicine

## 2013-12-22 VITALS — BP 126/82 | HR 72 | Temp 98.5°F | Resp 16 | Ht 69.25 in | Wt 170.0 lb

## 2013-12-22 DIAGNOSIS — I2510 Atherosclerotic heart disease of native coronary artery without angina pectoris: Secondary | ICD-10-CM

## 2013-12-22 DIAGNOSIS — I10 Essential (primary) hypertension: Secondary | ICD-10-CM

## 2013-12-22 DIAGNOSIS — R259 Unspecified abnormal involuntary movements: Secondary | ICD-10-CM

## 2013-12-22 DIAGNOSIS — R251 Tremor, unspecified: Secondary | ICD-10-CM

## 2013-12-22 LAB — POCT CBC
Granulocyte percent: 80.7 %G — AB (ref 37–80)
HCT, POC: 47.4 % (ref 43.5–53.7)
Hemoglobin: 15.8 g/dL (ref 14.1–18.1)
Lymph, poc: 1.2 (ref 0.6–3.4)
MCH, POC: 31.8 pg — AB (ref 27–31.2)
MCHC: 33.3 g/dL (ref 31.8–35.4)
MCV: 95.3 fL (ref 80–97)
MID (cbc): 0.2 (ref 0–0.9)
MPV: 7.8 fL (ref 0–99.8)
POC Granulocyte: 5.6 (ref 2–6.9)
POC LYMPH PERCENT: 16.8 %L (ref 10–50)
POC MID %: 2.5 %M (ref 0–12)
Platelet Count, POC: 156 10*3/uL (ref 142–424)
RBC: 4.98 M/uL (ref 4.69–6.13)
RDW, POC: 13.7 %
WBC: 7 10*3/uL (ref 4.6–10.2)

## 2013-12-22 LAB — COMPREHENSIVE METABOLIC PANEL
ALT: 13 U/L (ref 0–53)
AST: 22 U/L (ref 0–37)
Albumin: 4.5 g/dL (ref 3.5–5.2)
Alkaline Phosphatase: 68 U/L (ref 39–117)
BUN: 17 mg/dL (ref 6–23)
CO2: 28 mEq/L (ref 19–32)
Calcium: 9.3 mg/dL (ref 8.4–10.5)
Chloride: 101 mEq/L (ref 96–112)
Creat: 0.92 mg/dL (ref 0.50–1.35)
Glucose, Bld: 95 mg/dL (ref 70–99)
Potassium: 4.8 mEq/L (ref 3.5–5.3)
Sodium: 140 mEq/L (ref 135–145)
Total Bilirubin: 0.8 mg/dL (ref 0.2–1.2)
Total Protein: 7.4 g/dL (ref 6.0–8.3)

## 2013-12-22 LAB — LIPID PANEL
Cholesterol: 211 mg/dL — ABNORMAL HIGH (ref 0–200)
HDL: 33 mg/dL — ABNORMAL LOW (ref >39)
LDL Cholesterol: 144 mg/dL — ABNORMAL HIGH (ref 0–99)
Total CHOL/HDL Ratio: 6.4 Ratio
Triglycerides: 171 mg/dL — ABNORMAL HIGH (ref <150)
VLDL: 34 mg/dL (ref 0–40)

## 2013-12-22 MED ORDER — PROPRANOLOL HCL ER 80 MG PO CP24: 80.0000 mg | ORAL_CAPSULE | Freq: Every day | ORAL | Status: DC

## 2013-12-22 NOTE — Progress Notes (Signed)
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  He lives with son who he does not get along with.  He has a dog which he loves.  Patient is here for refill on Inderal.  He has three left.    He would also like lab work.  He's been diagnosed with htn 60 years ago.  He has a tremor which he believes came from a stroke he had when he was drinking heavily over 20 years ago.  No shortness of breath, chest pain, leg swelling  Patient had a stent placed two years ago by Dr. Hart RochesterLawson.  Nonsmoker.  Objective:  NAD, coarse tremor. HEENT:  Unremarkable Chest:  Clear Heart:  Faint I/VI systolic Abdomen:  No bruit or pulsatile mass, normal HSM Skin:  No rash Ext:  No edema  Assessment:  Stable CAD and hypertension  Plan:   Unspecified essential hypertension - Plan: propranolol ER (INDERAL LA) 80 MG 24 hr capsule  CAD in native artery - Plan: propranolol ER (INDERAL LA) 80 MG 24 hr capsule  Tremor - Plan: propranolol ER (INDERAL LA) 80 MG 24 hr capsule Advised to take baby aspirin daily  Signed, Elvina SidleKurt Arielys Wandersee, MD

## 2013-12-22 NOTE — Patient Instructions (Signed)
Take baby aspirin daily to prevent strokes or clogging of arteries.

## 2014-04-17 ENCOUNTER — Telehealth: Payer: Self-pay | Admitting: *Deleted

## 2014-04-17 NOTE — Telephone Encounter (Signed)
Unable to reach pt regarding flu shot.

## 2014-08-16 ENCOUNTER — Emergency Department (HOSPITAL_COMMUNITY)
Admission: EM | Admit: 2014-08-16 | Discharge: 2014-08-16 | Disposition: A | Discharge status: Home / Self Care | Payer: Medicare HMO | Attending: Emergency Medicine | Admitting: Emergency Medicine

## 2014-08-16 ENCOUNTER — Emergency Department (HOSPITAL_COMMUNITY): Payer: Medicare HMO

## 2014-08-16 ENCOUNTER — Encounter (HOSPITAL_COMMUNITY): Payer: Self-pay | Admitting: Emergency Medicine

## 2014-08-16 DIAGNOSIS — W19XXXA Unspecified fall, initial encounter: Secondary | ICD-10-CM

## 2014-08-16 DIAGNOSIS — S61412A Laceration without foreign body of left hand, initial encounter: Secondary | ICD-10-CM | POA: Diagnosis not present

## 2014-08-16 DIAGNOSIS — Y9289 Other specified places as the place of occurrence of the external cause: Secondary | ICD-10-CM | POA: Insufficient documentation

## 2014-08-16 DIAGNOSIS — S62308A Unspecified fracture of other metacarpal bone, initial encounter for closed fracture: Secondary | ICD-10-CM

## 2014-08-16 DIAGNOSIS — Y9301 Activity, walking, marching and hiking: Secondary | ICD-10-CM | POA: Insufficient documentation

## 2014-08-16 DIAGNOSIS — Z79899 Other long term (current) drug therapy: Secondary | ICD-10-CM | POA: Diagnosis not present

## 2014-08-16 DIAGNOSIS — Y998 Other external cause status: Secondary | ICD-10-CM | POA: Diagnosis not present

## 2014-08-16 DIAGNOSIS — I10 Essential (primary) hypertension: Secondary | ICD-10-CM | POA: Diagnosis not present

## 2014-08-16 DIAGNOSIS — Z7982 Long term (current) use of aspirin: Secondary | ICD-10-CM | POA: Insufficient documentation

## 2014-08-16 DIAGNOSIS — Z9841 Cataract extraction status, right eye: Secondary | ICD-10-CM | POA: Diagnosis not present

## 2014-08-16 DIAGNOSIS — Z23 Encounter for immunization: Secondary | ICD-10-CM | POA: Diagnosis not present

## 2014-08-16 DIAGNOSIS — S62324A Displaced fracture of shaft of fourth metacarpal bone, right hand, initial encounter for closed fracture: Secondary | ICD-10-CM | POA: Insufficient documentation

## 2014-08-16 DIAGNOSIS — W1839XA Other fall on same level, initial encounter: Secondary | ICD-10-CM | POA: Insufficient documentation

## 2014-08-16 DIAGNOSIS — Z87891 Personal history of nicotine dependence: Secondary | ICD-10-CM | POA: Diagnosis not present

## 2014-08-16 DIAGNOSIS — Z8659 Personal history of other mental and behavioral disorders: Secondary | ICD-10-CM | POA: Insufficient documentation

## 2014-08-16 MED ORDER — TETANUS-DIPHTHERIA TOXOIDS TD 5-2 LFU IM INJ
0.5000 mL | INJECTION | Freq: Once | INTRAMUSCULAR | Status: AC
Start: 2014-08-16 — End: 2014-08-16
  Administered 2014-08-16: 0.5 mL via INTRAMUSCULAR
  Filled 2014-08-16: qty 0.5

## 2014-08-16 MED ORDER — LIDOCAINE HCL 1 % IJ SOLN
INTRAMUSCULAR | Status: AC
  Administered 2014-08-16: 20 mL
  Filled 2014-08-16: qty 20

## 2014-08-16 NOTE — Discharge Instructions (Signed)
Local wound care with bacitracin and dressing changes twice daily.  Return to the ER for redness around the wound, pus draining from the wound, or streaks up and down the arm.  Follow-up with Dr. Mina Marble from hand surgery this week. The contact information has been provided in this discharge summary. Please call to arrange this appointment.   Hand Fracture, Metacarpals Fractures of metacarpals are breaks in the bones of the hand. They extend from the knuckles to the wrist. These bones can undergo many types of fractures. There are different ways of treating these fractures, all of which may be correct. TREATMENT  Hand fractures can be treated with:   Non-reduction - The fracture is casted without changing the positions of the fracture (bone pieces) involved. This fracture is usually left in a cast for 4 to 6 weeks or as your caregiver thinks necessary.  Closed reduction - The bones are moved back into position without surgery and then casted.  ORIF (open reduction and internal fixation) - The fracture site is opened and the bone pieces are fixed into place with some type of hardware, such as screws, etc. They are then casted. Your caregiver will discuss the type of fracture you have and the treatment that should be best for that problem. If surgery is chosen, let your caregivers know about the following.  LET YOUR CAREGIVERS KNOW ABOUT:  Allergies.  Medications you are taking, including herbs, eye drops, over the counter medications, and creams.  Use of steroids (by mouth or creams).  Previous problems with anesthetics or novocaine.  Possibility of pregnancy.  History of blood clots (thrombophlebitis).  History of bleeding or blood problems.  Previous surgeries.  Other health problems. AFTER THE PROCEDURE After surgery, you will be taken to the recovery area where a nurse will watch and check your progress. Once you are awake, stable, and taking fluids well, barring other  problems, you'll be allowed to go home. Once home, an ice pack applied to your operative site may help with pain and keep the swelling down. HOME CARE INSTRUCTIONS   Follow your caregiver's instructions as to activities, exercises, physical therapy, and driving a car.  Daily exercise is helpful for keeping range of motion and strength. Exercise as instructed.  To lessen swelling, keep the injured hand elevated above the level of your heart as much as possible.  Apply ice to the injury for 15-20 minutes each hour while awake for the first 2 days. Put the ice in a plastic bag and place a thin towel between the bag of ice and your cast.  Move the fingers of your casted hand several times a day.  If a plaster or fiberglass cast was applied:  Do not try to scratch the skin under the cast using a sharp or pointed object.  Check the skin around the cast every day. You may put lotion on red or sore areas.  Keep your cast dry. Your cast can be protected during bathing with a plastic bag. Do not put your cast into the water.  If a plaster splint was applied:  Wear your splint for as long as directed by your caregiver or until seen again.  Do not get your splint wet. Protect it during bathing with a plastic bag.  You may loosen the elastic bandage around the splint if your fingers start to get numb, tingle, get cold or turn blue.  Do not put pressure on your cast or splint; this may cause it to break.  Especially, do not lean plaster casts on hard surfaces for 24 hours after application.  Take medications as directed by your caregiver.  Only take over-the-counter or prescription medicines for pain, discomfort, or fever as directed by your caregiver.  Follow-up as provided by your caregiver. This is very important in order to avoid permanent injury or disability and chronic pain. SEEK MEDICAL CARE IF:   Increased bleeding (more than a small spot) from beneath your cast or splint if there is  beneath the cast as with an open reduction.  Redness, swelling, or increasing pain in the wound or from beneath your cast or splint.  Pus coming from wound or from beneath your cast or splint.  An unexplained oral temperature above 102 F (38.9 C) develops, or as your caregiver suggests.  A foul smell coming from the wound or dressing or from beneath your cast or splint.  You have a problem moving any of your fingers. SEEK IMMEDIATE MEDICAL CARE IF:   You develop a rash  You have difficulty breathing  You have any allergy problems If you do not have a window in your cast for observing the wound, a discharge or minor bleeding may show up as a stain on the outside of your cast. Report these findings to your caregiver. MAKE SURE YOU:   Understand these instructions.  Will watch your condition.  Will get help right away if you are not doing well or get worse. Document Released: 04/25/2005 Document Revised: 07/18/2011 Document Reviewed: 12/13/2007 [facility] Patient Information 2015 [place], [place]. This information is not intended to replace advice given to you by your health care provider. Make sure you discuss any questions you have with your health care provider.  Laceration Care, Adult A laceration is a cut or lesion that goes through all layers of the skin and into the tissue just beneath the skin. TREATMENT  Some lacerations may not require closure. Some lacerations may not be able to be closed due to an increased risk of infection. It is important to see your caregiver as soon as possible after an injury to minimize the risk of infection and maximize the opportunity for successful closure. If closure is appropriate, pain medicines may be given, if needed. The wound will be cleaned to help prevent infection. Your caregiver will use stitches (sutures), staples, wound glue (adhesive), or skin adhesive strips to repair the laceration. These tools bring the skin edges together to allow  for faster healing and a better cosmetic outcome. However, all wounds will heal with a scar. Once the wound has healed, scarring can be minimized by covering the wound with sunscreen during the day for 1 full year. HOME CARE INSTRUCTIONS  For sutures or staples:  Keep the wound clean and dry.  If you were given a bandage (dressing), you should change it at least once a day. Also, change the dressing if it becomes wet or dirty, or as directed by your caregiver.  Wash the wound with soap and water 2 times a day. Rinse the wound off with water to remove all soap. Pat the wound dry with a clean towel.  After cleaning, apply a thin layer of the antibiotic ointment as recommended by your caregiver. This will help prevent infection and keep the dressing from sticking.  You may shower as usual after the first 24 hours. Do not soak the wound in water until the sutures are removed.  Only take over-the-counter or prescription medicines for pain, discomfort, or fever as directed  by your caregiver.  Get your sutures or staples removed as directed by your caregiver. For skin adhesive strips:  Keep the wound clean and dry.  Do not get the skin adhesive strips wet. You may bathe carefully, using caution to keep the wound dry.  If the wound gets wet, pat it dry with a clean towel.  Skin adhesive strips will fall off on their own. You may trim the strips as the wound heals. Do not remove skin adhesive strips that are still stuck to the wound. They will fall off in time. For wound adhesive:  You may briefly wet your wound in the shower or bath. Do not soak or scrub the wound. Do not swim. Avoid periods of heavy perspiration until the skin adhesive has fallen off on its own. After showering or bathing, gently pat the wound dry with a clean towel.  Do not apply liquid medicine, cream medicine, or ointment medicine to your wound while the skin adhesive is in place. This may loosen the film before your wound  is healed.  If a dressing is placed over the wound, be careful not to apply tape directly over the skin adhesive. This may cause the adhesive to be pulled off before the wound is healed.  Avoid prolonged exposure to sunlight or tanning lamps while the skin adhesive is in place. Exposure to ultraviolet light in the first year will darken the scar.  The skin adhesive will usually remain in place for 5 to 10 days, then naturally fall off the skin. Do not pick at the adhesive film. You may need a tetanus shot if:  You cannot remember when you had your last tetanus shot.  You have never had a tetanus shot. If you get a tetanus shot, your arm may swell, get red, and feel warm to the touch. This is common and not a problem. If you need a tetanus shot and you choose not to have one, there is a rare chance of getting tetanus. Sickness from tetanus can be serious. SEEK MEDICAL CARE IF:   You have redness, swelling, or increasing pain in the wound.  You see a red line that goes away from the wound.  You have yellowish-white fluid (pus) coming from the wound.  You have a fever.  You notice a bad smell coming from the wound or dressing.  Your wound breaks open before or after sutures have been removed.  You notice something coming out of the wound such as wood or glass.  Your wound is on your hand or foot and you cannot move a finger or toe. SEEK IMMEDIATE MEDICAL CARE IF:   Your pain is not controlled with prescribed medicine.  You have severe swelling around the wound causing pain and numbness or a change in color in your arm, hand, leg, or foot.  Your wound splits open and starts bleeding.  You have worsening numbness, weakness, or loss of function of any joint around or beyond the wound.  You develop painful lumps near the wound or on the skin anywhere on your body. MAKE SURE YOU:   Understand these instructions.  Will watch your condition.  Will get help right away if you are  not doing well or get worse. Document Released: 04/25/2005 Document Revised: 07/18/2011 Document Reviewed: 10/19/2010 [facility] Patient Information 2015 [place]. This information is not intended to replace advice given to you by your health care provider. Make sure you discuss any questions you have with your health care  provider.

## 2014-08-16 NOTE — ED Notes (Signed)
Bed: WA07 Expected date:  Expected time:  Means of arrival:  Comments: Fall, hand lac

## 2014-08-16 NOTE — ED Notes (Addendum)
[facility] c/o left hand laceration from falling while walking his dog today. Patient alert and oriented. He did not hit head and NO LOC. He was bracing himself with his hands. Bleeding controlled. He reports no blood thinner use. Bruising noted to left wrist. Strong radial pulse present.

## 2014-08-16 NOTE — ED Notes (Signed)
Called Ortho tech to place splint

## 2014-08-16 NOTE — ED Notes (Signed)
MD Delo at bedside. 

## 2014-08-16 NOTE — ED Provider Notes (Signed)
CSN: [id number]     Arrival date & time 08/16/14  7829 History   First MD Initiated Contact with Patient 08/16/14 [phone]     Chief Complaint  Patient presents with  . Fall  . Extremity Laceration     (Consider location/radiation/quality/duration/timing/severity/associated sxs/prior Treatment) HPI Comments: Patient is an 81 year old male with history of hypertension. He presents for evaluation of a hand laceration sustained in a fall while walking his dog today. Another dog came along and spooked his dog causing the patient to lose his balance and fall. He hit his hand on the curb and caused a laceration to the palm.  Patient is a 81 y.o. male presenting with fall. The history is provided by the patient.  Fall This is a new problem. The current episode started less than 1 hour ago. The problem occurs constantly. The problem has not changed since onset.Nothing aggravates the symptoms. Nothing relieves the symptoms. He has tried nothing for the symptoms. The treatment provided no relief.    Past Medical History  Diagnosis Date  . Hypertension   . Anxiety   . AAA (abdominal aortic aneurysm)   . Cataract    Past Surgical History  Procedure Laterality Date  . Eye surgery  2008    Right  . Abdominal aortic aneurysm repair  [id number]    EVAR by Dr. Hart Rochester   Family History  Problem Relation Age of Onset  . COPD Mother    History  Substance Use Topics  . Smoking status: Former Smoker -- 0.50 packs/day for 20 years    Types: Cigarettes    Quit date: 12/25/1961  . Smokeless tobacco: Never Used  . Alcohol Use: No    Review of Systems  All other systems reviewed and are negative.     Allergies  Sulfonamide derivatives  Home Medications   Prior to Admission medications   Medication Sig Start Date End Date Taking? Authorizing Provider  aspirin 325 MG tablet Take 325 mg by mouth daily.    Historical Provider, MD  ibuprofen (ADVIL,MOTRIN) 200 MG tablet Take 200 mg by mouth every 6  (six) hours as needed. For pain    Historical Provider, MD  propranolol ER (INDERAL LA) 80 MG 24 hr capsule Take 1 capsule (80 mg total) by mouth daily. 12/22/13   Elvina Sidle, MD   BP 144/86 mmHg  Pulse 65  Temp(Src) 97.4 F (36.3 C) (Oral)  Resp 18  SpO2 99% Physical Exam  Constitutional: He is oriented to person, place, and time. He appears well-developed and well-nourished. No distress.  HENT:  Head: Normocephalic and atraumatic.  Neck: Normal range of motion. Neck supple.  Musculoskeletal:  There is a 4.5 cm laceration to the left palm. He is able to flex and extend his fingers without difficulty. The wound was explored and there are no foreign bodies. The laceration does not appear to extend to the tendons.  Neurological: He is alert and oriented to person, place, and time.  Skin: Skin is warm and dry. He is not diaphoretic.  Nursing note and vitals reviewed.   ED Course  Procedures (including critical care time) Labs Review Labs Reviewed - No data to display  Imaging Review No results found.   EKG Interpretation None     ED ECG REPORT LACERATION REPAIR Performed by: Geoffery Lyons Authorized by: Geoffery Lyons Consent: Verbal consent obtained. Risks and benefits: risks, benefits and alternatives were discussed Consent given by: patient Patient identity confirmed: provided demographic data Prepped and Draped  in normal sterile fashion Wound explored  Laceration Location: Left hand  Laceration Length: 4.5 cm  No Foreign Bodies seen or palpated  Anesthesia: local infiltration  Local anesthetic: lidocaine 1 % without epinephrine  Anesthetic total: 4 ml  Irrigation method: syringe Amount of cleaning: standard  Skin closure: 4-0 Prolene   Number of sutures: 7   Technique: Simple interrupted   Patient tolerance: Patient tolerated the procedure well with no immediate complications.   MDM   Final diagnoses:  None    Lack repaired and patient  tolerated this well. X-rays of the left hand are negative, however x-ray of the right hand does reveal what appears to be a nondisplaced fracture of the fourth metacarpal. He will be placed in a ulnar gutter splint and advised to follow-up with hand surgery.    Geoffery Lyonsouglas Christabel Camire, MD 08/16/14 1150

## 2014-08-16 NOTE — ED Notes (Signed)
Pt ambulating in hallway without difficulty. 

## 2014-08-26 ENCOUNTER — Ambulatory Visit (INDEPENDENT_AMBULATORY_CARE_PROVIDER_SITE_OTHER): Payer: Medicare HMO | Admitting: Family Medicine

## 2014-08-26 VITALS — BP 132/72 | HR 60 | Temp 98.0°F | Resp 16 | Ht 69.25 in | Wt 177.6 lb

## 2014-08-26 DIAGNOSIS — IMO0002 Reserved for concepts with insufficient information to code with codable children: Secondary | ICD-10-CM

## 2014-08-26 DIAGNOSIS — T148 Other injury of unspecified body region: Secondary | ICD-10-CM

## 2014-08-26 NOTE — Progress Notes (Signed)
° °  Subjective:  This chart was scribed for Norberto SorensonEva Shaw, MD by Charline BillsEssence Howell, ED Scribe. The patient was seen in room 7. Patient's care was started at 10:10 AM.   Patient ID: Jason Cameron, male    DOB: 12-17-1925, 81 y.o.   MRN: [id number]  Chief Complaint  Patient presents with   Suture / Staple Removal   HPI   HPI Comments: Jason Cameron is a [age] y.o. male, with a h/o HTN, AAA, anxiety, who presents to the [facility] for suture removal. Pt sustained a laceration to his L hand which required 7 simple interrupted sutures, placed on 08/16/14. Pt also fractured his right fourth metacarpal after a fall. He he was placed in an ulnar gutter splint and referred to hand surgeon.    Past Medical History  Diagnosis Date   Hypertension    Anxiety    AAA (abdominal aortic aneurysm)    Cataract    Current Outpatient Prescriptions on File Prior to Visit  Medication Sig Dispense Refill   aspirin 325 MG tablet Take 325 mg by mouth daily.     ibuprofen (ADVIL,MOTRIN) 200 MG tablet Take 200 mg by mouth every 6 (six) hours as needed. For pain     propranolol ER (INDERAL LA) 80 MG 24 hr capsule Take 1 capsule (80 mg total) by mouth daily. 90 capsule 3   No current facility-administered medications on file prior to visit.   Allergies  Allergen Reactions   Sulfonamide Derivatives Other (See Comments)    Childhood reaction    Review of Systems  Skin: Positive for wound.   BP 132/72 mmHg   Pulse 60   Temp(Src) 98 F (36.7 C) (Oral)   Resp 16   Ht 5' 9.25" (1.759 m)   Wt 177 lb 9.6 oz (80.559 kg)   BMI 26.04 kg/m2   SpO2 98%    Objective:   Physical Exam   Assessment & Plan:  Left palm laceration -    Base of wound appears to be slowly healing but more superficial layers of skin not yet healed - likely due to increase use of left hand with right fracture as well as widely placed sutures left many areas of the wound mobile and not well approximated so sutures  left in place, recheck in 4d for removal at that time (2 wks s/p laceration and repair) - fast track card given Is wearing brace on right hand and has seen hand surgeon who recommended bracing x 6 wks      I personally performed the services described in this documentation, which was scribed in my presence. The recorded information has been reviewed and considered, and addended by me as needed.  Norberto SorensonEva Shaw, MD MPH

## 2014-08-30 ENCOUNTER — Ambulatory Visit (INDEPENDENT_AMBULATORY_CARE_PROVIDER_SITE_OTHER): Payer: Medicare HMO | Admitting: Emergency Medicine

## 2014-08-30 VITALS — BP 114/80 | HR 67 | Temp 97.4°F | Resp 20 | Ht 69.25 in | Wt 181.0 lb

## 2014-08-30 DIAGNOSIS — IMO0002 Reserved for concepts with insufficient information to code with codable children: Secondary | ICD-10-CM

## 2014-08-30 DIAGNOSIS — Z4802 Encounter for removal of sutures: Secondary | ICD-10-CM

## 2014-08-30 NOTE — Progress Notes (Deleted)
   Subjective:    Patient ID: Jason Cameron, male    DOB: 1925-08-29, 81 y.o.   MRN: [id number]  HPI    Review of Systems     Objective:   Physical Exam        Assessment & Plan:

## 2014-08-30 NOTE — Progress Notes (Signed)
Subjective:  This chart was scribed for Jason Lites, MD by [facility], medical scribe at [facility].The patient was seen in exam room 10 and the patient's care was started at 3:20 PM.   Patient ID: Jason Cameron, male    DOB: [birth year], 81 y.o.   MRN: [id number] Chief Complaint  Patient presents with  . Suture / Staple Removal    suture removal   HPI HPI Comments: Jason Cameron is a 81 y.o. male who presents to the Emergency Department here for a suture removal. He was seen in the ED on 08/16/2014 after a laceration in his left hand and sutures were placed. He was seen on 08/26/2014 he was evaluated and told to return today for suture removal. Pt was walking his dog at the park, and he  tripped when he turned around because he saw a dog without a leash. Pt injured his left hand and had a suture. He is here for a follow up after 15 days of having sutures placed. Patient Active Problem List   Diagnosis Date Noted  . Abdominal aneurysm without mention of rupture 01/17/2012  . AAA (abdominal aortic aneurysm) 12/26/2011  . VITAMIN D DEFICIENCY 11/05/2009  . ANXIETY, CHRONIC 11/05/2009  . FREQUENCY, URINARY 11/05/2009  . HYPOTHYROIDISM 06/20/2007  . HYPERLIPIDEMIA 06/20/2007  . ANEMIA 06/20/2007  . HYPERTENSION 06/20/2007  . UNS ADVRS EFF OTH RX MEDICINAL&BIOLOGICAL SBSTNC 06/20/2007   Past Medical History  Diagnosis Date  . Hypertension   . Anxiety   . AAA (abdominal aortic aneurysm)   . Cataract    Past Surgical History  Procedure Laterality Date  . Eye surgery  2008    Right  . Abdominal aortic aneurysm repair  [id number]    EVAR by Dr. Hart Rochester   Allergies  Allergen Reactions  . Sulfonamide Derivatives Other (See Comments)    Childhood reaction    Prior to Admission medications   Medication Sig Start Date End Date Taking? Authorizing Provider  aspirin 325 MG tablet Take 325 mg by mouth daily.    Historical Provider, MD  ibuprofen  (ADVIL,MOTRIN) 200 MG tablet Take 200 mg by mouth every 6 (six) hours as needed. For pain    Historical Provider, MD  propranolol ER (INDERAL LA) 80 MG 24 hr capsule Take 1 capsule (80 mg total) by mouth daily. 12/22/13   Elvina Sidle, MD   History   Social History  . Marital Status: Single    Spouse Name: N/A  . Number of Children: N/A  . Years of Education: N/A   Occupational History  . Not on file.   Social History Main Topics  . Smoking status: Former Smoker -- 0.50 packs/day for 20 years    Types: Cigarettes    Quit date: 12/25/1961  . Smokeless tobacco: Never Used  . Alcohol Use: No  . Drug Use: No  . Sexual Activity: No   Other Topics Concern  . Not on file   Social History Narrative     Review of Systems  Skin: Positive for wound.       Objective:  BP 114/80 mmHg  Pulse 67  Temp(Src) 97.4 F (36.3 C) (Oral)  Resp 20  Ht 5' 9.25" (1.759 m)  Wt 181 lb (82.101 kg)  BMI 26.53 kg/m2  SpO2 97% Physical Exam  Nursing note and vitals reviewed. CONSTITUTIONAL: Well developed/well nourished HEAD: Normocephalic/atraumatic EYES: EOMI/PERRL ENMT: Mucous membranes moist NECK: supple no meningeal signs SPINE/BACK:entire spine nontender  CV: S1/S2 noted, no murmurs/rubs/gallops noted LUNGS: Lungs are clear to auscultation bilaterally, no apparent distress ABDOMEN: soft, nontender, no rebound or guarding, bowel sounds noted throughout abdomen GU:no cva tenderness NEURO: Pt is awake/alert/appropriate, moves all extremitiesx4.  No facial droop.   EXTREMITIES: pulses normal/equal, full ROM SKIN: warm, color normal. Healing laceration of the left palm however the proximal borders are rolled inward. PSYCH: no abnormalities of mood noted, alert and oriented to situation    Assessment & Plan:  Sutures were removed and stereostrips were applied, he is under care by Dr. Mina MarbleWeingold for a right hand fracture. He was advised that the borders of the wound have rolled in and it  will take longer for this to heal.I personally performed the services described in this documentation, which was scribed in my presence. The recorded information has been reviewed and is accurate.

## 2015-01-06 ENCOUNTER — Ambulatory Visit (INDEPENDENT_AMBULATORY_CARE_PROVIDER_SITE_OTHER): Payer: Medicare HMO | Admitting: Physician Assistant

## 2015-01-06 VITALS — BP 134/80 | HR 65 | Temp 97.7°F | Resp 16 | Ht 68.0 in | Wt 171.0 lb

## 2015-01-06 DIAGNOSIS — I10 Essential (primary) hypertension: Secondary | ICD-10-CM

## 2015-01-06 DIAGNOSIS — I2510 Atherosclerotic heart disease of native coronary artery without angina pectoris: Secondary | ICD-10-CM | POA: Diagnosis not present

## 2015-01-06 LAB — COMPREHENSIVE METABOLIC PANEL
ALBUMIN: 4.1 g/dL (ref 3.6–5.1)
ALK PHOS: 90 U/L (ref 40–115)
ALT: 17 U/L (ref 9–46)
AST: 23 U/L (ref 10–35)
BILIRUBIN TOTAL: 0.6 mg/dL (ref 0.2–1.2)
BUN: 20 mg/dL (ref 7–25)
CHLORIDE: 101 mmol/L (ref 98–110)
CO2: 32 mmol/L — ABNORMAL HIGH (ref 20–31)
CREATININE: 0.95 mg/dL (ref 0.70–1.11)
Calcium: 9.4 mg/dL (ref 8.6–10.3)
Glucose, Bld: 75 mg/dL (ref 65–99)
Potassium: 4.3 mmol/L (ref 3.5–5.3)
SODIUM: 139 mmol/L (ref 135–146)
TOTAL PROTEIN: 7.1 g/dL (ref 6.1–8.1)

## 2015-01-06 LAB — LIPID PANEL
CHOLESTEROL: 209 mg/dL — AB (ref 125–200)
HDL: 25 mg/dL — ABNORMAL LOW (ref >=40)
TRIGLYCERIDES: 465 mg/dL — AB (ref <150)
Total CHOL/HDL Ratio: 8.4 Ratio — ABNORMAL HIGH (ref <=5.0)

## 2015-01-06 MED ORDER — PROPRANOLOL HCL ER 80 MG PO CP24: 80.0000 mg | ORAL_CAPSULE | Freq: Every day | ORAL | Status: DC

## 2015-01-06 NOTE — Progress Notes (Signed)
   Subjective:    Patient ID: Jason Cameron, male    DOB: 05-08-1926, 81 y.o.   MRN: [id number]  HPI Patient presents for refill on propranolol for BP and states that he has been well in the interim.  Denies side effects to the medication. Denies SOB, CP, palpitations, edema, HA/dizziness. Says that he has not had a CPE in over 3 years. Denies following any particular diet or exercise. Checks BP at times, but does not recall the ranges. Quit smoking over 30 years ago. Denies illicit drug use or drinking alcohol. Med allergy Sulfa medications.    Review of Systems  Constitutional: Negative for fever, chills, diaphoresis and fatigue.  Respiratory: Negative for cough and shortness of breath.   Cardiovascular: Negative for chest pain, palpitations and leg swelling.  Gastrointestinal: Negative for nausea and vomiting.  Neurological: Negative for dizziness, weakness, light-headedness and headaches.       Objective:   Physical Exam  Constitutional: He is oriented to person, place, and time. He appears well-developed and well-nourished. No distress.  Blood pressure 134/80, pulse 65, temperature 97.7 F (36.5 C), temperature source Oral, resp. rate 16, height  (1.727 m), weight 171 lb (77.565 kg), SpO2 98 %.  HENT:  Head: Normocephalic and atraumatic.  Right Ear: External ear normal.  Left Ear: External ear normal.  Eyes: Conjunctivae are normal. Pupils are equal, round, and reactive to light. Right eye exhibits no discharge. Left eye exhibits no discharge. No scleral icterus.  Neck: Neck supple. No JVD present. No thyromegaly present.  Cardiovascular: Normal rate, regular rhythm, normal heart sounds and intact distal pulses.  Exam reveals no gallop and no friction rub.   No murmur heard. Pulmonary/Chest: Effort normal and breath sounds normal. No respiratory distress. He has no wheezes. He has no rales.  Abdominal: Soft. Bowel sounds are normal. He exhibits no distension. There is no  tenderness. There is no rebound and no guarding.  Musculoskeletal: He exhibits no edema.  Lymphadenopathy:    He has no cervical adenopathy.  Neurological: He is alert and oriented to person, place, and time.  Skin: Skin is warm and dry. No rash noted. He is not diaphoretic. No erythema. No pallor.  Psychiatric: He has a normal mood and affect. His behavior is normal. Judgment and thought content normal.       Assessment & Plan:  1. Essential hypertension 2. CAD in native artery Plans on scheduling CPE with Dr. Neva Seat in the next few months. Discussed the importance of staying active and AHA recommendations given. - propranolol ER (INDERAL LA) 80 MG 24 hr capsule; Take 1 capsule (80 mg total) by mouth daily.  Dispense: 90 capsule; Refill: 3 - Comprehensive metabolic panel - Lipid panel   Janan Ridge PA-C  [facility] 01/06/2015 2:30 PM

## 2015-01-06 NOTE — Patient Instructions (Signed)

## 2015-01-16 ENCOUNTER — Telehealth: Payer: Self-pay | Admitting: Physician Assistant

## 2015-01-16 DIAGNOSIS — E781 Pure hyperglyceridemia: Secondary | ICD-10-CM

## 2015-01-16 MED ORDER — ATORVASTATIN CALCIUM 10 MG PO TABS: 10.0000 mg | ORAL_TABLET | Freq: Every day | ORAL | Status: DC

## 2015-01-16 NOTE — Telephone Encounter (Signed)
Unable to leave voicemail. If patient calls back lab results show elevated triglycerides and have sent low dose atrovastatin to pharmacy to be taken daily. Would like to re-check in 3 months. Would like to discuss diet as well.

## 2015-01-21 ENCOUNTER — Telehealth: Payer: Self-pay | Admitting: Physician Assistant

## 2015-01-21 DIAGNOSIS — E781 Pure hyperglyceridemia: Secondary | ICD-10-CM

## 2015-01-21 MED ORDER — ATORVASTATIN CALCIUM 20 MG PO TABS: 20.0000 mg | ORAL_TABLET | Freq: Every day | ORAL | Status: AC

## 2015-01-21 NOTE — Telephone Encounter (Signed)
Called twice, but no vmail. Triglycerides elevated. Will increase Atorvastatin to 20 mg daily. Can finish up current rx, but then should switch to new increased rx. Avoidance of fried, fatty, and processed foods.

## 2015-01-23 NOTE — Telephone Encounter (Signed)
Tried to call Pt but was unable to get a voicemail.

## 2015-02-10 ENCOUNTER — Encounter: Payer: Self-pay | Admitting: Emergency Medicine

## 2016-02-01 ENCOUNTER — Ambulatory Visit (INDEPENDENT_AMBULATORY_CARE_PROVIDER_SITE_OTHER): Payer: Medicare HMO | Admitting: Physician Assistant

## 2016-02-01 VITALS — BP 130/70 | HR 58 | Temp 98.0°F | Resp 16 | Ht 68.0 in | Wt 173.0 lb

## 2016-02-01 DIAGNOSIS — I10 Essential (primary) hypertension: Secondary | ICD-10-CM | POA: Diagnosis not present

## 2016-02-01 DIAGNOSIS — Z760 Encounter for issue of repeat prescription: Secondary | ICD-10-CM | POA: Diagnosis not present

## 2016-02-01 LAB — CBC
HCT: 44.6 % (ref 38.5–50.0)
HEMOGLOBIN: 15 g/dL (ref 13.2–17.1)
MCH: 31.4 pg (ref 27.0–33.0)
MCHC: 33.6 g/dL (ref 32.0–36.0)
MCV: 93.5 fL (ref 80.0–100.0)
MPV: 10.9 fL (ref 7.5–12.5)
PLATELETS: 186 10*3/uL (ref 140–400)
RBC: 4.77 MIL/uL (ref 4.20–5.80)
RDW: 13.6 % (ref 11.0–15.0)
WBC: 7.6 10*3/uL (ref 3.8–10.8)

## 2016-02-01 LAB — COMPLETE METABOLIC PANEL WITH GFR
ALBUMIN: 4.2 g/dL (ref 3.6–5.1)
ALK PHOS: 55 U/L (ref 40–115)
ALT: 12 U/L (ref 9–46)
AST: 21 U/L (ref 10–35)
BILIRUBIN TOTAL: 0.6 mg/dL (ref 0.2–1.2)
BUN: 25 mg/dL (ref 7–25)
CALCIUM: 9.6 mg/dL (ref 8.6–10.3)
CO2: 24 mmol/L (ref 20–31)
Chloride: 102 mmol/L (ref 98–110)
Creat: 1.02 mg/dL (ref 0.70–1.11)
GFR, EST AFRICAN AMERICAN: 76 mL/min (ref >=60)
GFR, EST NON AFRICAN AMERICAN: 66 mL/min (ref >=60)
Glucose, Bld: 87 mg/dL (ref 65–99)
POTASSIUM: 4.4 mmol/L (ref 3.5–5.3)
SODIUM: 139 mmol/L (ref 135–146)
Total Protein: 7 g/dL (ref 6.1–8.1)

## 2016-02-01 LAB — LIPID PANEL
CHOL/HDL RATIO: 7.5 ratio — AB (ref <=5.0)
CHOLESTEROL: 218 mg/dL — AB (ref 125–200)
HDL: 29 mg/dL — ABNORMAL LOW (ref >=40)
LDL Cholesterol: 135 mg/dL — ABNORMAL HIGH (ref <130)
TRIGLYCERIDES: 269 mg/dL — AB (ref <150)
VLDL: 54 mg/dL — AB (ref <30)

## 2016-02-01 LAB — TSH: TSH: 1.6 mIU/L (ref 0.40–4.50)

## 2016-02-01 MED ORDER — PROPRANOLOL HCL ER 80 MG PO CP24: 80.0000 mg | ORAL_CAPSULE | Freq: Every day | ORAL | 3 refills | Status: AC

## 2016-02-01 NOTE — Patient Instructions (Signed)
     IF you received an x-ray today, you will receive an invoice from [facility]. Please contact [facility] at [phone] with questions or concerns regarding your invoice.   IF you received labwork today, you will receive an invoice from [facility]. Please contact [facility] at [phone] with questions or concerns regarding your invoice.   Our billing staff will not be able to assist you with questions regarding bills from these companies.  You will be contacted with the lab results as soon as they are available. The fastest way to get your results is to activate your My Chart account. Instructions are located on the last page of this paperwork. If you have not heard from us regarding the results in 2 weeks, please contact this office.      

## 2016-02-01 NOTE — Progress Notes (Signed)
02/01/2016 4:37 PM   DOB: 1925/07/18 / MRN: [id number]  SUBJECTIVE:  Jason Cameron is a 82 y.o. male presenting for refills of propanolol. He takes this for hypertension.  Reports he does not take Lipitor and never has.  He does take ASA 325 daily. He is status post abdominal aneurysm repair back in 2013.  He was asymptomatic at the time of the aneurysm and remains so today.    Depression screen PHQ 2/9 02/01/2016  Decreased Interest 0  Down, Depressed, Hopeless 0  PHQ - 2 Score 0     He is allergic to sulfonamide derivatives.   He  has a past medical history of AAA (abdominal aortic aneurysm) (HCC); Anxiety; Cataract; and Hypertension.    He  reports that he quit smoking about 54 years ago. His smoking use included Cigarettes. He has a 10.00 pack-year smoking history. He has never used smokeless tobacco. He reports that he does not drink alcohol or use drugs. He  reports that he does not engage in sexual activity. The patient  has a past surgical history that includes Eye surgery (2008) and Abdominal aortic aneurysm repair ([id number]).  His family history includes COPD in his mother.  Review of Systems  Respiratory: Negative for cough and shortness of breath.   Cardiovascular: Negative for chest pain, palpitations, orthopnea and leg swelling.  Skin: Negative for rash.  Neurological: Negative for dizziness, tingling, tremors and headaches.    The problem list and medications were reviewed and updated by myself where necessary and exist elsewhere in the encounter.   OBJECTIVE:  BP 130/70 (BP Location: Right Arm, Patient Position: Sitting, Cuff Size: Normal)   Pulse (!) 58   Temp 98 F (36.7 C) (Oral)   Resp 16   Ht 5\' 8"  (1.727 m)   Wt 173 lb (78.5 kg)   SpO2 93%   BMI 26.30 kg/m   Physical Exam  Constitutional: He is oriented to person, place, and time.  Cardiovascular: Normal rate, regular rhythm and normal heart sounds.  Exam reveals no gallop and no friction rub.     No murmur heard. Pulmonary/Chest: Effort normal and breath sounds normal. He has no wheezes. He has no rales.  Musculoskeletal: Normal range of motion. He exhibits no edema.  Neurological: He is alert and oriented to person, place, and time.  Skin: Skin is warm and dry. He is not diaphoretic.  Psychiatric: He has a normal mood and affect.    Lab Results  Component Value Date   CHOL 209 (H) 01/06/2015   HDL 25 (L) 01/06/2015   LDLCALC NOT CALC 01/06/2015   LDLDIRECT 151.0 11/09/2010   TRIG 465 (H) 01/06/2015   CHOLHDL 8.4 (H) 01/06/2015   Lab Results  Component Value Date   TSH 1.02 11/09/2010   Lab Results  Component Value Date   CREATININE 0.95 01/06/2015      No results found for this or any previous visit (from the past 72 hour(s)).  No results found.  ASSESSMENT AND PLAN  Jason Cameron was seen today for medication refill.  Diagnoses and all orders for this visit:  Medication refill -     COMPLETE METABOLIC PANEL WITH GFR -     CBC -     Lipid panel -     TSH  Essential hypertension: He is doing very well.  He was prescribed Lipitor however he has not filled this and per USPSTF there is no benefit for individuals greater than 75.   -  propranolol ER (INDERAL LA) 80 MG 24 hr capsule; Take 1 capsule (80 mg total) by mouth daily.    The patient is advised to call or return to clinic if he does not see an improvement in symptoms, or to seek the care of the closest emergency department if he worsens with the above plan.   Deliah Boston, MHS, PA-C [facility] 02/01/2016 4:37 PM

## 2016-06-03 ENCOUNTER — Telehealth: Payer: Self-pay | Admitting: Emergency Medicine

## 2016-06-03 NOTE — Telephone Encounter (Signed)
Pt came in person to the clinic requesting lab results from last office visit 01/30/2016.  Results given and message to post results routed to [place], [place]

## 2016-06-04 ENCOUNTER — Encounter: Payer: Self-pay | Admitting: Emergency Medicine

## 2016-06-04 ENCOUNTER — Ambulatory Visit (INDEPENDENT_AMBULATORY_CARE_PROVIDER_SITE_OTHER): Payer: Medicare HMO

## 2016-06-04 ENCOUNTER — Ambulatory Visit (INDEPENDENT_AMBULATORY_CARE_PROVIDER_SITE_OTHER): Payer: Medicare HMO | Admitting: Emergency Medicine

## 2016-06-04 VITALS — BP 140/66 | HR 54 | Temp 98.3°F | Resp 18 | Ht 70.0 in | Wt 167.2 lb

## 2016-06-04 DIAGNOSIS — Z23 Encounter for immunization: Secondary | ICD-10-CM

## 2016-06-04 DIAGNOSIS — I10 Essential (primary) hypertension: Secondary | ICD-10-CM

## 2016-06-04 DIAGNOSIS — R05 Cough: Secondary | ICD-10-CM

## 2016-06-04 DIAGNOSIS — R059 Cough, unspecified: Secondary | ICD-10-CM

## 2016-06-04 NOTE — Patient Instructions (Addendum)
     IF you received an x-ray today, you will receive an invoice from [facility]. Please contact [facility] at [phone] with questions or concerns regarding your invoice.   IF you received labwork today, you will receive an invoice from [facility]. Please contact [facility] at [phone] with questions or concerns regarding your invoice.   Our billing staff will not be able to assist you with questions regarding bills from these companies.  You will be contacted with the lab results as soon as they are available. The fastest way to get your results is to activate your My Chart account. Instructions are located on the last page of this paperwork. If you have not heard from us regarding the results in 2 weeks, please contact this office.      Cough, Adult Introduction A cough helps to clear your throat and lungs. A cough may last only 2-3 weeks (acute), or it may last longer than 8 weeks (chronic). Many different things can cause a cough. A cough may be a sign of an illness or another medical condition. Follow these instructions at home:  Pay attention to any changes in your cough.  Take medicines only as told by your doctor.  If you were prescribed an antibiotic medicine, take it as told by your doctor. Do not stop taking it even if you start to feel better.  Talk with your doctor before you try using a cough medicine.  Drink enough fluid to keep your pee (urine) clear or pale yellow.  If the air is dry, use a cold steam vaporizer or humidifier in your home.  Stay away from things that make you cough at work or at home.  If your cough is worse at night, try using extra pillows to raise your head up higher while you sleep.  Do not smoke, and try not to be around smoke. If you need help quitting, ask your doctor.  Do not have caffeine.  Do not drink alcohol.  Rest as needed. Contact a doctor if:  You have new problems (symptoms).  You cough up yellow  fluid (pus).  Your cough does not get better after 2-3 weeks, or your cough gets worse.  Medicine does not help your cough and you are not sleeping well.  You have pain that gets worse or pain that is not helped with medicine.  You have a fever.  You are losing weight and you do not know why.  You have night sweats. Get help right away if:  You cough up blood.  You have trouble breathing.  Your heartbeat is very fast. This information is not intended to replace advice given to you by your health care provider. Make sure you discuss any questions you have with your health care provider. Document Released: 01/06/2011 Document Revised: 10/01/2015 Document Reviewed: 07/02/2014  2017 Elsevier  

## 2016-06-04 NOTE — Progress Notes (Signed)
Jason Cameron y.o.   Chief Complaint  Patient presents with  . Chest x - ray  . Flu Vaccine    HISTORY OF PRESENT ILLNESS: This is a Cameron y.o. male complaining of intermittent non-productive cough without any other significant symptoms x several weeks.  HPI   Prior to Admission medications   Medication Sig Start Date End Date Taking? Authorizing Provider  aspirin 325 MG tablet Take 325 mg by mouth daily.   Yes Historical Provider, MD  ibuprofen (ADVIL,MOTRIN) 200 MG tablet Take 200 mg by mouth every 6 (six) hours as needed. For pain   Yes Historical Provider, MD  propranolol ER (INDERAL LA) 80 MG 24 hr capsule Take 1 capsule (80 mg total) by mouth daily. 02/01/16  Yes Ofilia NeasMichael L Clark, PA-C  atorvastatin (LIPITOR) 20 MG tablet Take 1 tablet (20 mg total) by mouth daily. Patient not taking: Reported on 06/04/2016 01/21/15   Tishira R Brewington, PA-C    Allergies  Allergen Reactions  . Sulfonamide Derivatives Other (See Comments)    Childhood reaction     Patient Active Problem List   Diagnosis Date Noted  . Abdominal aneurysm without mention of rupture 01/17/2012  . AAA (abdominal aortic aneurysm) (HCC) 12/26/2011  . VITAMIN D DEFICIENCY 11/05/2009  . ANXIETY, CHRONIC 11/05/2009  . FREQUENCY, URINARY 11/05/2009  . HYPOTHYROIDISM 06/20/2007  . HYPERLIPIDEMIA 06/20/2007  . ANEMIA 06/20/2007  . HYPERTENSION 06/20/2007  . UNS ADVRS EFF OTH RX MEDICINAL&BIOLOGICAL SBSTNC 06/20/2007    Past Medical History:  Diagnosis Date  . AAA (abdominal aortic aneurysm) (HCC)   . Anxiety   . Cataract   . Hypertension     Past Surgical History:  Procedure Laterality Date  . ABDOMINAL AORTIC ANEURYSM REPAIR  [id number]   EVAR by Dr. Hart RochesterLawson  . EYE SURGERY  2008   Right    Social History   Social History  . Marital status: Single    Spouse name: N/A  . Number of children: N/A  . Years of education: N/A   Occupational History  . Not on file.   Social History Main Topics   . Smoking status: Former Smoker    Packs/day: 0.50    Years: 20.00    Types: Cigarettes    Quit date: 12/25/1961  . Smokeless tobacco: Never Used  . Alcohol use No  . Drug use: No  . Sexual activity: No   Other Topics Concern  . Not on file   Social History Narrative  . No narrative on file    Family History  Problem Relation Age of Onset  . COPD Mother      Review of Systems  Constitutional: Negative for chills and fever.  HENT: Negative for congestion, nosebleeds and sinus pain.   Eyes: Negative for blurred vision, discharge and redness.  Respiratory: Positive for cough. Negative for hemoptysis, shortness of breath and wheezing.   Cardiovascular: Negative for chest pain and palpitations.  Gastrointestinal: Negative for abdominal pain, diarrhea, nausea and vomiting.  Genitourinary: Negative for dysuria and hematuria.  Skin: Negative for rash.  Neurological: Negative for dizziness and headaches.  Endo/Heme/Allergies: Negative.   Psychiatric/Behavioral: Negative for depression.  All other systems reviewed and are negative.  Vitals:   06/04/16 1024  BP: 140/66  Pulse: (!) 54  Resp: 18  Temp: 98.3 F (36.8 C)     Physical Exam  Constitutional: He is oriented to person, place, and time. He appears well-developed and well-nourished.  HENT:  Head: Normocephalic and  atraumatic.  Nose: Nose normal.  Mouth/Throat: No oropharyngeal exudate.  Eyes: Conjunctivae and EOM are normal. Pupils are equal, round, and reactive to light.  Neck: Normal range of motion. Neck supple. No JVD present. No thyromegaly present.  Cardiovascular: Normal rate, regular rhythm and normal heart sounds.   Pulmonary/Chest: Effort normal and breath sounds normal.  Abdominal: Soft. Bowel sounds are normal. There is no tenderness. There is no guarding.  Musculoskeletal: Normal range of motion.  Lymphadenopathy:    He has no cervical adenopathy.  Neurological: He is alert and oriented to person,  place, and time. No sensory deficit. He exhibits normal muscle tone.  Skin: Skin is warm and dry. Capillary refill takes less than 2 seconds.  Psychiatric: He has a normal mood and affect. His behavior is normal.  Vitals reviewed.  CXR reviewed: NAD  ASSESSMENT & PLAN: Jason Cameron was seen today for chest x - ray and flu vaccine.  Diagnoses and all orders for this visit:  Essential hypertension -     DG Chest 2 View; Future  Flu vaccine need -     Flu Vaccine QUAD 36+ mos IM  Cough    Patient Instructions        IF you received an x-ray today, you will receive an invoice from [facility]. Please contact [facility] at [phone] with questions or concerns regarding your invoice.   IF you received labwork today, you will receive an invoice from [facility]. Please contact [facility] at [phone] with questions or concerns regarding your invoice.   Our billing staff will not be able to assist you with questions regarding bills from these companies.  You will be contacted with the lab results as soon as they are available. The fastest way to get your results is to activate your My Chart account. Instructions are located on the last page of this paperwork. If you have not heard from [place] regarding the results in 2 weeks, please contact this office.      Cough, Adult Introduction A cough helps to clear your throat and lungs. A cough may last only 2-3 weeks (acute), or it may last longer than 8 weeks (chronic). Many different things can cause a cough. A cough may be a sign of an illness or another medical condition. Follow these instructions at home:  Pay attention to any changes in your cough.  Take medicines only as told by your doctor.  If you were prescribed an antibiotic medicine, take it as told by your doctor. Do not stop taking it even if you start to feel better.  Talk with your doctor before you try using a cough medicine.  Drink enough fluid to  keep your pee (urine) clear or pale yellow.  If the air is dry, use a cold steam vaporizer or humidifier in your home.  Stay away from things that make you cough at work or at home.  If your cough is worse at night, try using extra pillows to raise your head up higher while you sleep.  Do not smoke, and try not to be around smoke. If you need help quitting, ask your doctor.  Do not have caffeine.  Do not drink alcohol.  Rest as needed. Contact a doctor if:  You have new problems (symptoms).  You cough up yellow fluid (pus).  Your cough does not get better after 2-3 weeks, or your cough gets worse.  Medicine does not help your cough and you are not sleeping well.  You have  pain that gets worse or pain that is not helped with medicine.  You have a fever.  You are losing weight and you do not know why.  You have night sweats. Get help right away if:  You cough up blood.  You have trouble breathing.  Your heartbeat is very fast. This information is not intended to replace advice given to you by your health care provider. Make sure you discuss any questions you have with your health care provider. Document Released: 01/06/2011 Document Revised: 10/01/2015 Document Reviewed: 07/02/2014  2017 Elsevier     Edwina Barth, MD [facility]

## 2016-10-05 ENCOUNTER — Telehealth: Payer: Self-pay

## 2016-10-05 NOTE — Telephone Encounter (Signed)
Called pt to schedule medicare annual wellness visit - nr

## 2016-12-27 ENCOUNTER — Telehealth: Payer: Self-pay

## 2016-12-27 NOTE — Telephone Encounter (Signed)
Called pt to schedule Medicare Annual Wellness Visit. -nr  

## 2017-05-21 ENCOUNTER — Telehealth: Payer: Self-pay | Admitting: Family Medicine

## 2017-05-21 NOTE — Telephone Encounter (Signed)
I can take care of it.  You'll need to walk me through the process because I have never filled one out.Dr. Katrinka BlazingSmith please chime in if you prefer to complete.  Deliah BostonMichael Alvilda Mckenna, MS, PA-C 6:17 PM, 05/21/2017

## 2017-05-21 NOTE — Telephone Encounter (Signed)
Answering service call Saturday 08-01-17 at 5:30 pm.  Spoke with EMS who had been called out to pt's house by pt's son. Pt was DOA - in full rigor. Appears to have been walking down the hall and had an event and died immediately - he was found in his hallway lying face down on the floor. No signs of any distress.  Pt is a 84 yo with a known h/o AAA who lives alone. His son checks on him twice a day but was not able to that morning as he was helping someone move so last seen alive 24 hrs prior - Fri 1/11 at 5 pm.  [employer] also on scene and agreed with EMS assessment.  Advised ok to send death certificate to PCP for completion. Looks like Chestine SporeClark is listed as PCP but pt was last seen her 1 yr prior by [place] - would either of you be willing to complete. If not, I'll figure it out. Thanks, Carley HammedEva

## 2017-05-22 NOTE — Telephone Encounter (Signed)
Out of the office this week, yet happy to assist PA Chestine Sporelark with completion of death certificate.

## 2017-05-24 ENCOUNTER — Telehealth: Payer: Self-pay | Admitting: Family Medicine

## 2017-05-24 NOTE — Telephone Encounter (Signed)
Copied from CRM [phone]. Topic: General - Deceased Patient >> May 24, 2017 10:45 AM Cecelia ByarsGreen, Temeka L, RMA wrote: Reason for CRM: Evlyn KannerSoo Killian from [employer] is calling to find out if Dr. Chilton SiGreen will sign death certificate, please return her call at [phone]    >> May 24, 2017 10:51 AM Cecelia ByarsGreen, Temeka L, RMA wrote: Pt was found dead at home on 02-12-18 and [facility]

## 2017-05-24 NOTE — Telephone Encounter (Signed)
Casimiro NeedleMichael are you still following up with this?  Copied from CRM [phone]. Topic: General - Deceased Patient >> May 24, 2017 10:45 AM Cecelia ByarsGreen, Temeka L, RMA wrote: Reason for CRM: Evlyn KannerSoo Killian from [employer] is calling to find out if Dr. Chilton SiGreen will sign death certificate, please return her call at [phone]    >> May 24, 2017 10:51 AM Cecelia ByarsGreen, Temeka L, RMA wrote: Pt was found dead at home on 04-01-2018 and [facility]

## 2017-05-24 NOTE — Telephone Encounter (Signed)
Yes.  I can sign it.  I have not seen it yet.  Deliah BostonMichael Barnes Florek, MS, PA-C 4:48 PM, 05/24/2017

## 2017-05-24 NOTE — Telephone Encounter (Signed)
Copied from CRM [phone]. Topic: General - Deceased Patient >> May 24, 2017 10:45 AM Cecelia ByarsGreen, Sowmya Partridge L, RMA wrote: Reason for CRM: Evlyn KannerSoo Killian from [employer] is calling to find out if Dr. Chilton SiGreen will sign death certificate, please return her call at [phone]

## 2017-06-09 DEATH — deceased
